# Patient Record
Sex: Female | Born: 1995 | Race: White | Hispanic: No | Marital: Married | Smoking: Never smoker
Health system: Southern US, Community
[De-identification: ages and names within clinical notes are randomized; demographics above are authoritative.]

## PROBLEM LIST (undated history)

## (undated) DIAGNOSIS — H539 Unspecified visual disturbance: Secondary | ICD-10-CM

## (undated) DIAGNOSIS — K209 Esophagitis, unspecified without bleeding: Secondary | ICD-10-CM

## (undated) DIAGNOSIS — J302 Other seasonal allergic rhinitis: Secondary | ICD-10-CM

## (undated) DIAGNOSIS — F419 Anxiety disorder, unspecified: Secondary | ICD-10-CM

## (undated) DIAGNOSIS — R109 Unspecified abdominal pain: Secondary | ICD-10-CM

## (undated) DIAGNOSIS — F32A Depression, unspecified: Secondary | ICD-10-CM

## (undated) DIAGNOSIS — F329 Major depressive disorder, single episode, unspecified: Secondary | ICD-10-CM

## (undated) DIAGNOSIS — T8859XA Other complications of anesthesia, initial encounter: Secondary | ICD-10-CM

## (undated) HISTORY — PX: TONSILLECTOMY AND ADENOIDECTOMY: SUR1326

## (undated) HISTORY — DX: Esophagitis, unspecified without bleeding: K20.90

## (undated) HISTORY — DX: Esophagitis, unspecified: K20.9

## (undated) HISTORY — DX: Other seasonal allergic rhinitis: J30.2

---
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
## 2015-02-10 ENCOUNTER — Ambulatory Visit (HOSPITAL_COMMUNITY): Payer: Self-pay | Admitting: Psychiatry

## 2015-02-12 ENCOUNTER — Encounter (HOSPITAL_COMMUNITY): Payer: Self-pay | Admitting: Psychiatry

## 2015-02-12 ENCOUNTER — Ambulatory Visit (INDEPENDENT_AMBULATORY_CARE_PROVIDER_SITE_OTHER): Payer: 59 | Admitting: Psychiatry

## 2015-02-12 VITALS — BP 114/75 | HR 93 | Ht 61.0 in | Wt 141.2 lb

## 2015-02-12 DIAGNOSIS — F4310 Post-traumatic stress disorder, unspecified: Secondary | ICD-10-CM | POA: Diagnosis not present

## 2015-02-12 DIAGNOSIS — F411 Generalized anxiety disorder: Secondary | ICD-10-CM

## 2015-02-12 DIAGNOSIS — F331 Major depressive disorder, recurrent, moderate: Secondary | ICD-10-CM | POA: Diagnosis not present

## 2015-02-12 DIAGNOSIS — F4010 Social phobia, unspecified: Secondary | ICD-10-CM | POA: Diagnosis not present

## 2015-02-12 MED ORDER — BUPROPION HCL ER (SR) 200 MG PO TB12: ORAL_TABLET | ORAL | Status: DC

## 2015-02-12 NOTE — Progress Notes (Signed)
[facility]  Olivia SatoDarcy Kerns [id number] 20 y.o.  02/12/2015 10:59 AM  Chief Complaint: I'm doing well  History of Present Illness:  Pt states she is doing well.   She is back in school as of yesterday.   Mood has been up and down due to stress but denies depression. Reports she gets irritable quickly and will stay that way for 2 days at a time for no reason. Mood is good   Denies anhedonia, worthlessness and hopelessness. Admits when really upset she has a passing thought of SIB but is able to over come it and has not acted on it. Denies SI/HI and denies AVH.   Denies manic and hypomanic symptoms including periods of decreased need for sleep, increased energy, mood lability, impulsivity, FOI, and excessive spending.  Anxiety remains high. She is trying to control it and takes Vistaril about twice a month when she feels a panic attack might be coming on.   Day to day she is dealing with social anxiety and anxious about everything. In social situations she avoids talking to new people whenever possible. Also also worries about confrontation with people. She has some anxiety about her school weird. Driving makes her nervous.   PTSD- it is better. Triggers cause symptoms but most of time symptoms care stable. Flashbacks and intrusive memories when encountering triggers. Holidays with family can be awkward but went well this year. HV is high.   States that her sleep (7hrs) and appetite are good. Energy is fair.  Patient denies alcohol and substance abuse.  Patient is tolerating her medications well and denies SE. Meds are working well.   Suicidal Ideation: No Plan Formed: No Patient has means to carry out plan: No  Homicidal Ideation: No Plan Formed: No Patient has means to carry out plan: No  Review of Systems: Psychiatric: Agitation: Yes Hallucination: No Depressed Mood: No Insomnia: No Hypersomnia: No Altered Concentration: No Feels Worthless:  No Grandiose Ideas: No Belief In Special Powers: No New/Increased Substance Abuse: No Compulsions: No  Neurologic: Headache: No Seizure: No Paresthesias: No   Review of Systems  Constitutional: Negative for fever and chills.  HENT: Negative for congestion, ear pain, hearing loss, nosebleeds and sore throat.   Eyes: Negative for blurred vision, double vision and pain.  Respiratory: Negative for cough, shortness of breath and wheezing.   Cardiovascular: Negative for chest pain, palpitations and leg swelling.  Gastrointestinal: Negative for heartburn, nausea, vomiting, abdominal pain, diarrhea and constipation.  Musculoskeletal: Positive for joint pain. Negative for back pain, falls and neck pain.  Skin: Negative for itching and rash.  Neurological: Negative for dizziness, tremors, sensory change, seizures, loss of consciousness, weakness and headaches.  Endo/Heme/Allergies: Negative for environmental allergies and polydipsia. Does not bruise/bleed easily.  Psychiatric/Behavioral: Negative for depression, suicidal ideas, hallucinations and substance abuse. The patient is nervous/anxious. The patient does not have insomnia.      Past Medical Family, Social History: works at [employer]. Living with adopted family.  reports that she has never smoked. She has never used smokeless tobacco. She reports that she does not drink alcohol or use illicit drugs.  Family History  Problem Relation Age of Onset  . Adopted: Yes  . Bipolar disorder Mother   . Drug abuse Mother   . Drug abuse Father   . Bipolar disorder Father    Past Medical History  Diagnosis Date  . Seasonal allergies   . Esophagitis   . Vision abnormalities  wears glasses  . Anxiety     takes Citalopram daily   . Depression   . Abdominal pain     daily    Outpatient Encounter Prescriptions as of 02/12/2015  Medication Sig  . buPROPion (WELLBUTRIN SR) 150 MG 12 hr tablet TAKE 1 TABLET (150 MG TOTAL) BY MOUTH  2 (TWO) TIMES DAILY.  . hydrOXYzine (ATARAX/VISTARIL) 10 MG tablet TAKE 1 TABLET (10 MG TOTAL) BY MOUTH 3 (THREE) TIMES DAILY AS NEEDED FOR ANXIETY.  Marland Kitchen norethindrone-ethinyl estradiol-iron (ESTROSTEP FE,TILIA FE,TRI-LEGEST FE) 1-20/1-30/1-35 MG-MCG tablet Take 1 tablet by mouth daily.   No facility-administered encounter medications on file as of 02/12/2015.    Past Psychiatric History/Hospitalization(s): Anxiety: Yes Bipolar Disorder: No Depression: Yes Mania: No Psychosis: No Schizophrenia: No Personality Disorder: No Hospitalization for psychiatric illness: No History of Electroconvulsive Shock Therapy: No Prior Suicide Attempts: Yes  Previous meds- Abilify- weight gain, Celexa  Physical Exam: Constitutional:  BP 114/75 mmHg  Pulse 93  Ht 5\' 1"  (1.549 m)  Wt 141 lb 3.2 oz (64.048 kg)  BMI 26.69 kg/m2  General Appearance: alert, oriented, no acute distress  Musculoskeletal: Strength & Muscle Tone: within normal limits Gait & Station: normal Patient leans: N/A  Mental Status Examination/Evaluation: Objective: Attitude: Calm and cooperative  Appearance: Casual, appears to be stated age  Eye Contact::  Good  Speech:  Clear and Coherent and Normal Rate  Volume:  Normal  Mood:  Mildly anxious   Affect:  Full Range  Thought Process:  Goal Directed, Linear and Logical  Orientation:  Full (Time, Place, and Person)  Thought Content:  WDL   Suicidal Thoughts:  No  Homicidal Thoughts:  No  Judgement:  Fair  Insight:  Fair  Concentration: good  Memory: Immediate-good Recent-good Remote-good  Recall: fair  Language: fair  Gait and Station: normal  [employer] of Knowledge: average  Psychomotor Activity:  Normal  Akathisia:  No  Handed:  Right  AIMS (if indicated):  n/a  Assets:  Communication Skills Desire for Improvement Housing Physical Health Resilience Social Support Talents/Skills Vocational/Educational       Medical Decision Making (Choose Three):  Established Problem, Stable/Improving (1), Review of Psycho-Social Stressors (1), Established Problem, Worsening (2), Review of Medication Regimen & Side Effects (2) and Review of New Medication or Change in Dosage (2)  Assessment: Axis I: PTSD; MDD, recurrent, moderate; GAD; Social anxiety disorder  Axis II: deferred    Plan:  Medication management with supportive therapy. Risks/benefits and SE of the medication discussed. Pt verbalized understanding and verbal consent obtained for treatment.  Affirm with the patient that the medications are taken as ordered. Patient expressed understanding of how their medications were to be used.   increase Bupropion SR to 200mg  po BID for depression Vistaril 10mg  po TID prn anxiety- No script given as pt reports she has some at home.   Therapy: brief supportive therapy provided. Discussed psychosocial stressors in detail.  Pt is no longer therapy at this time. Pt declined therapy referral at this time due to cost.   Pt denies SI and is at an acute low risk for suicide.Patient told to call clinic if any problems occur. Patient advised to go to ER if they should develop SI/HI, side effects, or if symptoms worsen. Has crisis numbers to call if needed. Pt verbalized understanding.  F/up in 2 months or sooner if needed  Oletta Darter MD 02/12/2015

## 2015-04-16 ENCOUNTER — Encounter (HOSPITAL_COMMUNITY): Payer: Self-pay | Admitting: Psychiatry

## 2015-04-16 ENCOUNTER — Ambulatory Visit (INDEPENDENT_AMBULATORY_CARE_PROVIDER_SITE_OTHER): Payer: 59 | Admitting: Psychiatry

## 2015-04-16 VITALS — BP 104/68 | HR 97 | Ht 61.0 in | Wt 131.8 lb

## 2015-04-16 DIAGNOSIS — F4010 Social phobia, unspecified: Secondary | ICD-10-CM | POA: Diagnosis not present

## 2015-04-16 DIAGNOSIS — F331 Major depressive disorder, recurrent, moderate: Secondary | ICD-10-CM

## 2015-04-16 DIAGNOSIS — F411 Generalized anxiety disorder: Secondary | ICD-10-CM

## 2015-04-16 DIAGNOSIS — F4310 Post-traumatic stress disorder, unspecified: Secondary | ICD-10-CM

## 2015-04-16 MED ORDER — BUPROPION HCL ER (SR) 200 MG PO TB12
ORAL_TABLET | ORAL | Status: DC
Start: 2015-04-16 — End: 2015-07-21

## 2015-04-16 NOTE — Progress Notes (Signed)
Patient ID: Olivia Woodard, female   DOB: Jun 11, 1995, 20 y.o.   MRN: [id number]  [facility]  Olivia Woodard [id number] 20 y.o.  04/16/2015 2:32 PM  Chief Complaint: good but stressful  History of Present Illness:  Pt states she is doing well.   Trying to balance school and work and personal life. Pt has made some friends at school. Relationship with boyfriend is good.   Mood has been up and down due to stress but denies depression. Reports irritability has decreased with dose change.   Denies anhedonia, worthlessness and hopelessness. Denies SIB. Denies SI/HI and denies AVH.   Denies manic and hypomanic symptoms including periods of decreased need for sleep, increased energy, mood lability, impulsivity, FOI, and excessive spending.  Anxiety remains high. Pt will have restless energy and clean to avoid stress. She is trying to control it and takes Vistaril about twice a month when she feels a panic attack might be coming on.   Day to day she is dealing with social anxiety and is anxious about everything. In social situations she avoids talking to new people whenever possible. Also also worries about confrontation with people. Feels she is managing it well.  PTSD- it is better. Triggers cause symptoms but most of time symptoms care stable. Flashbacks and intrusive memories when encountering triggers. HV is high.   States that her sleep (7hrs) and appetite are good. Energy is fair.  Patient denies alcohol and substance abuse.  Patient is tolerating her medications well and denies SE. Meds are working well.   Suicidal Ideation: No Plan Formed: No Patient has means to carry out plan: No  Homicidal Ideation: No Plan Formed: No Patient has means to carry out plan: No  Review of Systems: Psychiatric: Agitation: No Hallucination: No Depressed Mood: No Insomnia: No Hypersomnia: No Altered Concentration: No Feels Worthless: No Grandiose Ideas: No Belief In  Special Powers: No New/Increased Substance Abuse: No Compulsions: No   Review of Systems  Constitutional: Negative for fever and chills.  HENT: Negative for congestion, ear pain, hearing loss, nosebleeds and sore throat.   Eyes: Negative for blurred vision, double vision and pain.  Respiratory: Negative for cough, shortness of breath and wheezing.   Cardiovascular: Negative for chest pain, palpitations and leg swelling.  Gastrointestinal: Negative for heartburn, nausea, vomiting, abdominal pain, diarrhea and constipation.  Musculoskeletal: Negative for back pain, joint pain, falls and neck pain.  Skin: Negative for itching and rash.  Neurological: Negative for dizziness, tremors, sensory change, seizures, loss of consciousness, weakness and headaches.  Endo/Heme/Allergies: Negative for environmental allergies and polydipsia. Does not bruise/bleed easily.  Psychiatric/Behavioral: Negative for depression, suicidal ideas, hallucinations and substance abuse. The patient is nervous/anxious. The patient does not have insomnia.      Past Medical Family, Social History: works at [employer]. Living with adopted family.  Going to [employer] and is studying early childhood education.  reports that she has never smoked. She has never used smokeless tobacco. She reports that she does not drink alcohol or use illicit drugs.  Family History  Problem Relation Age of Onset  . Adopted: Yes  . Bipolar disorder Mother   . Drug abuse Mother   . Drug abuse Father   . Bipolar disorder Father    Past Medical History  Diagnosis Date  . Seasonal allergies   . Esophagitis   . Vision abnormalities     wears glasses  . Anxiety     takes Citalopram daily   .  Depression   . Abdominal pain     daily    Outpatient Encounter Prescriptions as of 04/16/2015  Medication Sig  . buPROPion (WELLBUTRIN SR) 200 MG 12 hr tablet TAKE 1 TABLET (200 MG TOTAL) BY MOUTH 2 (TWO) TIMES DAILY.  . hydrOXYzine  (ATARAX/VISTARIL) 10 MG tablet TAKE 1 TABLET (10 MG TOTAL) BY MOUTH 3 (THREE) TIMES DAILY AS NEEDED FOR ANXIETY.  Marland Kitchen norethindrone-ethinyl estradiol-iron (ESTROSTEP FE,TILIA FE,TRI-LEGEST FE) 1-20/1-30/1-35 MG-MCG tablet Take 1 tablet by mouth daily.   No facility-administered encounter medications on file as of 04/16/2015.    Past Psychiatric History/Hospitalization(s): Anxiety: Yes Bipolar Disorder: No Depression: Yes Mania: No Psychosis: No Schizophrenia: No Personality Disorder: No Hospitalization for psychiatric illness: No History of Electroconvulsive Shock Therapy: No Prior Suicide Attempts: Yes  Previous meds- Abilify- weight gain, Celexa  Physical Exam: Constitutional:  BP 104/68 mmHg  Pulse 97  Ht  (1.549 m)  Wt 131 lb 12.8 oz (59.784 kg)  BMI 24.92 kg/m2  LMP 04/01/2015 (Approximate)  General Appearance: alert, oriented, no acute distress  Musculoskeletal: Strength & Muscle Tone: within normal limits Gait & Station: normal Patient leans: straight  Mental Status Examination/Evaluation: Objective: Attitude: Calm and cooperative  Appearance: Casual, appears to be stated age  Eye Contact::  Good  Speech:  Clear and Coherent and Normal Rate  Volume:  Normal  Mood:  Mildly anxious   Affect:  Full Range  Thought Process:  Goal Directed, Linear and Logical  Orientation:  Full (Time, Place, and Person)  Thought Content:  WDL   Suicidal Thoughts:  No  Homicidal Thoughts:  No  Judgement:  Fair  Insight:  Fair  Concentration: good  Memory: Immediate-good Recent-good Remote-good  Recall: fair  Language: fair  Gait and Station: normal  [employer] of Knowledge: average  Psychomotor Activity:  Normal  Akathisia:  No  Handed:  Right  AIMS (if indicated):  n/a  Assets:  Communication Skills Desire for Improvement Housing Physical Health Resilience Social Support Talents/Skills Vocational/Educational       Medical Decision Making (Choose Three):  Established Problem, Stable/Improving (1), Review of Psycho-Social Stressors (1) and Review of Medication Regimen & Side Effects (2)  Assessment: Axis I: PTSD; MDD, recurrent, moderate; GAD; Social anxiety disorder  Axis II: deferred    Plan:  Medication management with supportive therapy. Risks/benefits and SE of the medication discussed. Pt verbalized understanding and verbal consent obtained for treatment.  Affirm with the patient that the medications are taken as ordered. Patient expressed understanding of how their medications were to be used.   Bupropion SR to  po BID for depression Vistaril  po TID prn anxiety- No script given as pt reports she has some at home.   Therapy: brief supportive therapy provided. Discussed psychosocial stressors in detail.  Pt is no longer therapy at this time. Pt declined therapy referral at this time due to cost.   Pt denies SI and is at an acute low risk for suicide.Patient told to call clinic if any problems occur. Patient advised to go to ER if they should develop SI/HI, side effects, or if symptoms worsen. Has crisis numbers to call if needed. Pt verbalized understanding.  F/up in 3 months or sooner if needed  Oletta Darter MD 04/16/2015

## 2015-04-26 ENCOUNTER — Other Ambulatory Visit (HOSPITAL_COMMUNITY): Payer: Self-pay | Admitting: Psychiatry

## 2015-07-21 ENCOUNTER — Encounter (HOSPITAL_COMMUNITY): Payer: Self-pay | Admitting: Psychiatry

## 2015-07-21 ENCOUNTER — Ambulatory Visit (HOSPITAL_COMMUNITY): Payer: Self-pay | Admitting: Psychiatry

## 2015-07-21 ENCOUNTER — Ambulatory Visit (INDEPENDENT_AMBULATORY_CARE_PROVIDER_SITE_OTHER): Payer: 59 | Admitting: Psychiatry

## 2015-07-21 VITALS — BP 110/70 | HR 102 | Ht 61.0 in | Wt 130.0 lb

## 2015-07-21 DIAGNOSIS — F411 Generalized anxiety disorder: Secondary | ICD-10-CM

## 2015-07-21 DIAGNOSIS — F4310 Post-traumatic stress disorder, unspecified: Secondary | ICD-10-CM

## 2015-07-21 DIAGNOSIS — F331 Major depressive disorder, recurrent, moderate: Secondary | ICD-10-CM

## 2015-07-21 DIAGNOSIS — F4010 Social phobia, unspecified: Secondary | ICD-10-CM | POA: Diagnosis not present

## 2015-07-21 MED ORDER — BUPROPION HCL ER (SR) 200 MG PO TB12: ORAL_TABLET | ORAL | Status: DC

## 2015-07-21 NOTE — Progress Notes (Signed)
Patient ID: Olivia Woodard, female   DOB: [birth year], 20 y.o.   MRN: [id number] Patient ID: Olivia Woodard, female   DOB: [birth year], 20 y.o.   MRN: [id number]  [facility] Progress Note  Olivia Woodard [id number] 20 y.o.  07/21/2015 8:40 AM  Chief Complaint: pretty good  History of Present Illness:  Pt states she is doing well.   Relationship with boyfriend is good. Personal life is going well. Off for the summer for school. She managed a 4.0 at the end of the semester.   Mood has been up and down due to stress but denies depression. Mood is more stable since school ended. She is social.    Denies anhedonia, worthlessness and hopelessness. Denies SIB. Denies SI/HI and denies AVH.   Denies manic and hypomanic symptoms including periods of decreased need for sleep, increased energy, mood lability, impulsivity, FOI, and excessive spending.  Anxiety was high at the end of the semester. Pt still has random periods of  restless energy. She is trying to control it and takes Vistaril about twice a month when she feels a panic attack might be coming on.   Day to day she is dealing with social anxiety and is anxious about everything. In social situations she avoids talking to new people whenever possible. Also also worries about confrontation with people. Feels she is managing it well.  PTSD- it is better. Triggers cause symptoms but most of time symptoms care stable. Flashbacks and intrusive memories when encountering triggers. HV is high.   States that her sleep (7hrs) and appetite are good. Energy is fair.  Patient denies alcohol and substance abuse.  Patient is tolerating her medications well and denies SE. Meds are working well.   Suicidal Ideation: No Plan Formed: No Patient has means to carry out plan: No  Homicidal Ideation: No Plan Formed: No Patient has means to carry out plan: No  Review of Systems: Psychiatric: Agitation: No Hallucination: No Depressed Mood: No Insomnia:  No Hypersomnia: No Altered Concentration: No Feels Worthless: No Grandiose Ideas: No Belief In Special Powers: No New/Increased Substance Abuse: No Compulsions: No   Review of Systems  Constitutional: Negative for fever and chills.  HENT: Negative for congestion, ear pain, hearing loss, nosebleeds and sore throat.   Eyes: Negative for blurred vision, double vision and pain.  Respiratory: Negative for cough, shortness of breath and wheezing.   Cardiovascular: Negative for chest pain, palpitations and leg swelling.  Gastrointestinal: Negative for heartburn, nausea, vomiting, abdominal pain, diarrhea and constipation.  Musculoskeletal: Negative for back pain, joint pain, falls and neck pain.  Skin: Negative for itching and rash.  Neurological: Negative for dizziness, tremors, sensory change, seizures, loss of consciousness, weakness and headaches.  Endo/Heme/Allergies: Negative for environmental allergies and polydipsia. Does not bruise/bleed easily.  Psychiatric/Behavioral: Negative for depression, suicidal ideas, hallucinations and substance abuse. The patient is nervous/anxious. The patient does not have insomnia.      Past Medical Family, Social History: works at [employer]. Living with adopted family.  Going to [employer] and is studying early childhood education.  reports that she has never smoked. She has never used smokeless tobacco. She reports that she does not drink alcohol or use illicit drugs.  Family History  Problem Relation Age of Onset  . Adopted: Yes  . Bipolar disorder Mother   . Drug abuse Mother   . Drug abuse Father   . Bipolar disorder Father    Past Medical History  Diagnosis Date  . Seasonal  allergies   . Esophagitis   . Vision abnormalities     wears glasses  . Anxiety     takes Citalopram daily   . Depression   . Abdominal pain     daily    Outpatient Encounter Prescriptions as of 07/21/2015  Medication Sig  . buPROPion (WELLBUTRIN SR) 200 MG  12 hr tablet TAKE 1 TABLET (200 MG TOTAL) BY MOUTH 2 (TWO) TIMES DAILY.  . hydrOXYzine (ATARAX/VISTARIL) 10 MG tablet TAKE 1 TABLET (10 MG TOTAL) BY MOUTH 3 (THREE) TIMES DAILY AS NEEDED FOR ANXIETY.  Marland Kitchen norethindrone-ethinyl estradiol-iron (ESTROSTEP FE,TILIA FE,TRI-LEGEST FE) 1-20/1-30/1-35 MG-MCG tablet Take 1 tablet by mouth daily.   No facility-administered encounter medications on file as of 07/21/2015.    Past Psychiatric History/Hospitalization(s): Anxiety: Yes Bipolar Disorder: No Depression: Yes Mania: No Psychosis: No Schizophrenia: No Personality Disorder: No Hospitalization for psychiatric illness: No History of Electroconvulsive Shock Therapy: No Prior Suicide Attempts: Yes  Previous meds- Abilify- weight gain, Celexa  Physical Exam: Constitutional:  BP 110/70 mmHg  Pulse 102  Ht  (1.549 m)  Wt 130 lb (58.968 kg)  BMI 24.58 kg/m2  General Appearance: alert, oriented, no acute distress  Musculoskeletal: Strength & Muscle Tone: within normal limits Gait & Station: normal Patient leans: straight  Mental Status Examination/Evaluation: Objective: Attitude: Calm and cooperative  Appearance: Casual, appears to be stated age  Eye Contact::  Good  Speech:  Clear and Coherent and Normal Rate  Volume:  Normal  Mood:  Mildly anxious   Affect:  Full Range  Thought Process:  Goal Directed, Linear and Logical  Orientation:  Full (Time, Place, and Person)  Thought Content:  WDL   Suicidal Thoughts:  No  Homicidal Thoughts:  No  Judgement:  Fair  Insight:  Fair  Concentration: good  Memory: Immediate-good Recent-good Remote-good  Recall: fair  Language: fair  Gait and Station: normal  [employer] of Knowledge: average  Psychomotor Activity:  Normal  Akathisia:  No  Handed:  Right  AIMS (if indicated):  n/a  Assets:  Communication Skills Desire for Improvement Housing Physical Health Resilience Social Support Talents/Skills Vocational/Educational        Medical Decision Making (Choose Three): Established Problem, Stable/Improving (1), Review of Psycho-Social Stressors (1) and Review of Medication Regimen & Side Effects (2)  Assessment: Axis I: PTSD; MDD, recurrent, moderate; GAD; Social anxiety disorder  Axis II: deferred    Plan:  Medication management with supportive therapy. Risks/benefits and SE of the medication discussed. Pt verbalized understanding and verbal consent obtained for treatment.  Affirm with the patient that the medications are taken as ordered. Patient expressed understanding of how their medications were to be used.   Bupropion SR to  po BID for depression Vistaril  po TID prn anxiety- No script given as pt reports she has some at home.   Therapy: brief supportive therapy provided. Discussed psychosocial stressors in detail.  Pt is no longer therapy at this time. Pt declined therapy referral at this time due to cost.   Pt denies SI and is at an acute low risk for suicide.Patient told to call clinic if any problems occur. Patient advised to go to ER if they should develop SI/HI, side effects, or if symptoms worsen. Has crisis numbers to call if needed. Pt verbalized understanding.  F/up in 3 months or sooner if needed  Oletta Darter MD 07/21/2015

## 2015-08-07 ENCOUNTER — Other Ambulatory Visit (HOSPITAL_COMMUNITY): Payer: Self-pay | Admitting: Psychiatry

## 2015-10-22 ENCOUNTER — Ambulatory Visit (INDEPENDENT_AMBULATORY_CARE_PROVIDER_SITE_OTHER): Payer: 59 | Admitting: Psychiatry

## 2015-10-22 ENCOUNTER — Encounter (HOSPITAL_COMMUNITY): Payer: Self-pay | Admitting: Psychiatry

## 2015-10-22 DIAGNOSIS — F331 Major depressive disorder, recurrent, moderate: Secondary | ICD-10-CM

## 2015-10-22 DIAGNOSIS — F4310 Post-traumatic stress disorder, unspecified: Secondary | ICD-10-CM | POA: Diagnosis not present

## 2015-10-22 DIAGNOSIS — F4010 Social phobia, unspecified: Secondary | ICD-10-CM | POA: Diagnosis not present

## 2015-10-22 DIAGNOSIS — F411 Generalized anxiety disorder: Secondary | ICD-10-CM | POA: Diagnosis not present

## 2015-10-22 MED ORDER — BUPROPION HCL ER (SR) 200 MG PO TB12: ORAL_TABLET | ORAL | 0 refills | Status: DC

## 2015-10-22 NOTE — Progress Notes (Signed)
Patient ID: Olivia Woodard, female   DOB: [birth year], 20 y.o.   MRN: [id number] Patient ID: Olivia Woodard, female   DOB: May 01, 1995, 20 y.o.   MRN: [id number]  [facility] Progress Note  Olivia Woodard [id number] 20 y.o.  10/22/2015 9:06 AM  Chief Complaint: pretty good  History of Present Illness:  Pt states she is doing well. Pt is working at [employer] and likes it.  Relationship with boyfriend is good. Personal life is going well. This semester is going well and she has figured out how to manage her schedule.   Depression has improved but is still present. Season change always bring on mood changes. She is social.  Denies anhedonia, worthlessness and hopelessness. Denies SIB. Denies SI/HI and denies AVH.   Denies manic and hypomanic symptoms including periods of decreased need for sleep, increased energy, mood lability, impulsivity, FOI, and excessive spending.  Anxiety remains high and always present. Pt still has random periods of restless energy. She is trying to control it and takes Vistaril about twice a month when she feels a panic attack might be coming on.   Day to day she is dealing with social anxiety and is anxious about everything. In social situations she avoids talking to new people whenever possible. Also also worries about confrontation with people. Feels she is managing it well.  PTSD- it is better. Triggers cause symptoms but most of time symptoms care stable. Flashbacks and intrusive memories when encountering triggers. HV is high.   States that her sleep (7hrs) and appetite are good. Energy is fair.  Patient denies alcohol and substance abuse.  Patient is tolerating her medications well and denies SE. Meds are working well.   Suicidal Ideation: No Plan Formed: No Patient has means to carry out plan: No  Homicidal Ideation: No Plan Formed: No Patient has means to carry out plan: No  Review of Systems: Psychiatric: Agitation: No Hallucination:  No Depressed Mood: Yes Insomnia: No Hypersomnia: No Altered Concentration: No Feels Worthless: No Grandiose Ideas: No Belief In Special Powers: No New/Increased Substance Abuse: No Compulsions: No   Review of Systems  Constitutional: Negative for chills and fever.  HENT: Positive for congestion. Negative for ear pain, hearing loss, nosebleeds and sore throat.   Eyes: Negative for blurred vision, double vision and pain.  Respiratory: Negative for cough, shortness of breath and wheezing.   Cardiovascular: Negative for chest pain, palpitations and leg swelling.  Gastrointestinal: Negative for abdominal pain, constipation, diarrhea, heartburn, nausea and vomiting.  Musculoskeletal: Positive for joint pain and neck pain. Negative for back pain and falls.  Skin: Negative for itching and rash.  Neurological: Negative for dizziness, tremors, sensory change, seizures, loss of consciousness, weakness and headaches.  Endo/Heme/Allergies: Positive for environmental allergies. Negative for polydipsia. Does not bruise/bleed easily.  Psychiatric/Behavioral: Negative for depression, hallucinations, substance abuse and suicidal ideas. The patient is nervous/anxious. The patient does not have insomnia.      Past Medical Family, Social History: works at daycare. Living with adopted family.  Going to [employer] and is studying early childhood education.  reports that she has never smoked. She has never used smokeless tobacco. She reports that she does not drink alcohol or use drugs.  Family History  Problem Relation Age of Onset  . Adopted: Yes  . Bipolar disorder Mother   . Drug abuse Mother   . Drug abuse Father   . Bipolar disorder Father    Past Medical History:  Diagnosis Date  .  Abdominal pain    daily  . Anxiety    takes Citalopram daily   . Depression   . Esophagitis   . Seasonal allergies   . Vision abnormalities    wears glasses    Outpatient Encounter Prescriptions as of 10/22/2015   Medication Sig  . buPROPion (WELLBUTRIN SR) 200 MG 12 hr tablet TAKE 1 TABLET (200 MG TOTAL) BY MOUTH 2 (TWO) TIMES DAILY.  . hydrOXYzine (ATARAX/VISTARIL) 10 MG tablet TAKE 1 TABLET (10 MG TOTAL) BY MOUTH 3 (THREE) TIMES DAILY AS NEEDED FOR ANXIETY.  Marland Kitchen. norethindrone-ethinyl estradiol-iron (ESTROSTEP FE,TILIA FE,TRI-LEGEST FE) 1-20/1-30/1-35 MG-MCG tablet Take 1 tablet by mouth daily.   No facility-administered encounter medications on file as of 10/22/2015.     Past Psychiatric History/Hospitalization(s): Anxiety: Yes Bipolar Disorder: No Depression: Yes Mania: No Psychosis: No Schizophrenia: No Personality Disorder: No Hospitalization for psychiatric illness: No History of Electroconvulsive Shock Therapy: No Prior Suicide Attempts: Yes  Previous meds- Abilify- weight gain, Celexa  Physical Exam: Constitutional:  BP 110/70   Pulse 91   Ht 5\' 1"  (1.549 m)   Wt 135 lb 6.4 oz (61.4 kg)   BMI 25.58 kg/m   General Appearance: alert, oriented, no acute distress  Musculoskeletal: Strength & Muscle Tone: within normal limits Gait & Station: normal Patient leans: straight  Mental Status Examination/Evaluation: Objective: Attitude: Calm and cooperative  Appearance: Casual, appears to be stated age  Eye Contact::  Good  Speech:  Clear and Coherent and Normal Rate  Volume:  Normal  Mood:  Mildly anxious   Affect:  Full Range  Thought Process:  Goal Directed, Linear and Logical  Orientation:  Full (Time, Place, and Person)  Thought Content:  WDL   Suicidal Thoughts:  No  Homicidal Thoughts:  No  Judgement:  Fair  Insight:  Fair  Concentration: good  Memory: Immediate-good Recent-good Remote-good  Recall: fair  Language: fair  Gait and Station: normal  [employer] of Knowledge: average  Psychomotor Activity:  Normal  Akathisia:  No  Handed:  Right  AIMS (if indicated):  n/a  Assets:  Communication Skills Desire for Improvement Housing Physical  Health Resilience Social Support Talents/Skills Vocational/Educational        Assessment: Axis I: PTSD; MDD, recurrent, moderate; GAD; Social anxiety disorder  Axis II: deferred    Plan:  Medication management with supportive therapy. Risks/benefits and SE of the medication discussed. Pt verbalized understanding and verbal consent obtained for treatment.  Affirm with the patient that the medications are taken as ordered. Patient expressed understanding of how their medications were to be used.   Bupropion SR to 200mg  po BID for depression Vistaril 10mg  po TID prn anxiety- No script given as pt reports she has some at home.   Therapy: brief supportive therapy provided. Discussed psychosocial stressors in detail.  Pt is no longer therapy at this time. Pt declined therapy referral at this time due to cost.   Pt denies SI and is at an acute low risk for suicide.Patient told to call clinic if any problems occur. Patient advised to go to ER if they should develop SI/HI, side effects, or if symptoms worsen. Has crisis numbers to call if needed. Pt verbalized understanding.  F/up in 3 months or sooner if needed  Oletta DarterSalina Aleks Nawrot MD 10/22/15

## 2015-11-14 ENCOUNTER — Other Ambulatory Visit (HOSPITAL_COMMUNITY): Payer: Self-pay | Admitting: Psychiatry

## 2015-11-14 DIAGNOSIS — F331 Major depressive disorder, recurrent, moderate: Secondary | ICD-10-CM

## 2015-11-14 DIAGNOSIS — F4010 Social phobia, unspecified: Secondary | ICD-10-CM

## 2015-11-14 DIAGNOSIS — F411 Generalized anxiety disorder: Secondary | ICD-10-CM

## 2015-11-14 DIAGNOSIS — F4310 Post-traumatic stress disorder, unspecified: Secondary | ICD-10-CM

## 2016-01-28 ENCOUNTER — Ambulatory Visit (HOSPITAL_COMMUNITY): Payer: Self-pay | Admitting: Psychiatry

## 2016-02-12 ENCOUNTER — Other Ambulatory Visit (HOSPITAL_COMMUNITY): Payer: Self-pay | Admitting: Psychiatry

## 2016-02-12 DIAGNOSIS — F331 Major depressive disorder, recurrent, moderate: Secondary | ICD-10-CM

## 2016-02-12 DIAGNOSIS — F411 Generalized anxiety disorder: Secondary | ICD-10-CM

## 2016-02-12 DIAGNOSIS — F4310 Post-traumatic stress disorder, unspecified: Secondary | ICD-10-CM

## 2016-02-12 DIAGNOSIS — F4010 Social phobia, unspecified: Secondary | ICD-10-CM

## 2016-03-22 ENCOUNTER — Ambulatory Visit (HOSPITAL_COMMUNITY): Payer: Self-pay | Admitting: Psychiatry

## 2016-04-06 ENCOUNTER — Other Ambulatory Visit (HOSPITAL_COMMUNITY): Payer: Self-pay | Admitting: Psychiatry

## 2016-04-06 DIAGNOSIS — F331 Major depressive disorder, recurrent, moderate: Secondary | ICD-10-CM

## 2016-04-06 DIAGNOSIS — F4010 Social phobia, unspecified: Secondary | ICD-10-CM

## 2016-04-06 DIAGNOSIS — F4310 Post-traumatic stress disorder, unspecified: Secondary | ICD-10-CM

## 2016-04-06 DIAGNOSIS — F411 Generalized anxiety disorder: Secondary | ICD-10-CM

## 2016-04-11 ENCOUNTER — Other Ambulatory Visit (HOSPITAL_COMMUNITY): Payer: Self-pay | Admitting: Psychiatry

## 2016-04-11 DIAGNOSIS — F411 Generalized anxiety disorder: Secondary | ICD-10-CM

## 2016-04-11 DIAGNOSIS — F4010 Social phobia, unspecified: Secondary | ICD-10-CM

## 2016-04-11 DIAGNOSIS — F331 Major depressive disorder, recurrent, moderate: Secondary | ICD-10-CM

## 2016-04-11 DIAGNOSIS — F4310 Post-traumatic stress disorder, unspecified: Secondary | ICD-10-CM

## 2016-04-27 ENCOUNTER — Other Ambulatory Visit (HOSPITAL_COMMUNITY): Payer: Self-pay | Admitting: Psychiatry

## 2016-04-27 DIAGNOSIS — F4310 Post-traumatic stress disorder, unspecified: Secondary | ICD-10-CM

## 2016-04-27 DIAGNOSIS — F331 Major depressive disorder, recurrent, moderate: Secondary | ICD-10-CM

## 2016-04-27 DIAGNOSIS — F4010 Social phobia, unspecified: Secondary | ICD-10-CM

## 2016-04-27 DIAGNOSIS — F411 Generalized anxiety disorder: Secondary | ICD-10-CM

## 2016-05-02 ENCOUNTER — Other Ambulatory Visit (HOSPITAL_COMMUNITY): Payer: Self-pay | Admitting: Psychiatry

## 2016-05-02 DIAGNOSIS — F4010 Social phobia, unspecified: Secondary | ICD-10-CM

## 2016-05-02 DIAGNOSIS — F4310 Post-traumatic stress disorder, unspecified: Secondary | ICD-10-CM

## 2016-05-02 DIAGNOSIS — F331 Major depressive disorder, recurrent, moderate: Secondary | ICD-10-CM

## 2016-05-02 DIAGNOSIS — F411 Generalized anxiety disorder: Secondary | ICD-10-CM

## 2016-05-16 ENCOUNTER — Telehealth (HOSPITAL_COMMUNITY): Payer: Self-pay

## 2016-05-16 ENCOUNTER — Other Ambulatory Visit (HOSPITAL_COMMUNITY): Payer: Self-pay

## 2016-05-16 DIAGNOSIS — F4010 Social phobia, unspecified: Secondary | ICD-10-CM

## 2016-05-16 DIAGNOSIS — F331 Major depressive disorder, recurrent, moderate: Secondary | ICD-10-CM

## 2016-05-16 DIAGNOSIS — F411 Generalized anxiety disorder: Secondary | ICD-10-CM

## 2016-05-16 DIAGNOSIS — F4310 Post-traumatic stress disorder, unspecified: Secondary | ICD-10-CM

## 2016-05-16 NOTE — Telephone Encounter (Signed)
Patient called for a refill on her Wellbutrin. She was last seen in September of last year and has a follow up in June. I wanted to ask you first because it has been so long since she has been seen

## 2016-05-19 NOTE — Telephone Encounter (Signed)
Since it has been so long I will wait to discuss meds at her scheduled appt in June.

## 2016-05-19 NOTE — Telephone Encounter (Signed)
I called the patient and left a voicemail and let her know.

## 2016-05-30 ENCOUNTER — Other Ambulatory Visit (HOSPITAL_COMMUNITY): Payer: Self-pay | Admitting: Psychiatry

## 2016-05-30 DIAGNOSIS — F331 Major depressive disorder, recurrent, moderate: Secondary | ICD-10-CM

## 2016-05-30 DIAGNOSIS — F4010 Social phobia, unspecified: Secondary | ICD-10-CM

## 2016-05-30 DIAGNOSIS — F4310 Post-traumatic stress disorder, unspecified: Secondary | ICD-10-CM

## 2016-05-30 DIAGNOSIS — F411 Generalized anxiety disorder: Secondary | ICD-10-CM

## 2016-06-01 ENCOUNTER — Telehealth (HOSPITAL_COMMUNITY): Payer: Self-pay

## 2016-06-01 NOTE — Telephone Encounter (Signed)
Patient called about her Wellbutrin prescription, you had denied it due to the patient not being seen since last September. She had an appointment in February, but it was on a Tuesday and was cancelled because you were no longer working that day. Patient was given a refill at that time and when she called to make the follow up she was told that June 14 was the first available. She said she had enough medication to last until the end of April and has been out for about a week. Her follow up is no June 14 and she would like to know if you would please refill until that date. Please review and advise, thank you

## 2016-06-02 ENCOUNTER — Ambulatory Visit (INDEPENDENT_AMBULATORY_CARE_PROVIDER_SITE_OTHER): Payer: No Typology Code available for payment source | Admitting: Psychiatry

## 2016-06-02 ENCOUNTER — Encounter (HOSPITAL_COMMUNITY): Payer: Self-pay | Admitting: Psychiatry

## 2016-06-02 DIAGNOSIS — F331 Major depressive disorder, recurrent, moderate: Secondary | ICD-10-CM

## 2016-06-02 DIAGNOSIS — F4010 Social phobia, unspecified: Secondary | ICD-10-CM | POA: Diagnosis not present

## 2016-06-02 DIAGNOSIS — F411 Generalized anxiety disorder: Secondary | ICD-10-CM | POA: Diagnosis not present

## 2016-06-02 DIAGNOSIS — F4310 Post-traumatic stress disorder, unspecified: Secondary | ICD-10-CM

## 2016-06-02 DIAGNOSIS — Z813 Family history of other psychoactive substance abuse and dependence: Secondary | ICD-10-CM | POA: Diagnosis not present

## 2016-06-02 DIAGNOSIS — Z818 Family history of other mental and behavioral disorders: Secondary | ICD-10-CM | POA: Diagnosis not present

## 2016-06-02 MED ORDER — BUPROPION HCL ER (SR) 200 MG PO TB12: ORAL_TABLET | ORAL | 0 refills | Status: DC

## 2016-06-02 NOTE — Telephone Encounter (Signed)
Yes we can refill it
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
## 2016-06-02 NOTE — Telephone Encounter (Signed)
This was sent to the pharmacy by Dr. Michae KavaAgarwal, I called the patient and lvm letting her know.

## 2016-07-14 ENCOUNTER — Ambulatory Visit (HOSPITAL_COMMUNITY): Payer: Self-pay | Admitting: Psychiatry

## 2016-08-30 ENCOUNTER — Other Ambulatory Visit (HOSPITAL_COMMUNITY): Payer: Self-pay | Admitting: Psychiatry

## 2016-08-30 DIAGNOSIS — F4310 Post-traumatic stress disorder, unspecified: Secondary | ICD-10-CM

## 2016-08-30 DIAGNOSIS — F331 Major depressive disorder, recurrent, moderate: Secondary | ICD-10-CM

## 2016-08-30 DIAGNOSIS — F411 Generalized anxiety disorder: Secondary | ICD-10-CM

## 2016-08-30 DIAGNOSIS — F4010 Social phobia, unspecified: Secondary | ICD-10-CM

## 2016-09-01 ENCOUNTER — Ambulatory Visit (HOSPITAL_COMMUNITY): Payer: Self-pay | Admitting: Psychiatry

## 2016-10-04 ENCOUNTER — Other Ambulatory Visit (HOSPITAL_COMMUNITY): Payer: Self-pay | Admitting: Psychiatry

## 2016-10-04 DIAGNOSIS — F4310 Post-traumatic stress disorder, unspecified: Secondary | ICD-10-CM

## 2016-10-04 DIAGNOSIS — F331 Major depressive disorder, recurrent, moderate: Secondary | ICD-10-CM

## 2016-10-04 DIAGNOSIS — F4010 Social phobia, unspecified: Secondary | ICD-10-CM

## 2016-10-04 DIAGNOSIS — F411 Generalized anxiety disorder: Secondary | ICD-10-CM

## 2016-10-20 ENCOUNTER — Ambulatory Visit (INDEPENDENT_AMBULATORY_CARE_PROVIDER_SITE_OTHER): Payer: No Typology Code available for payment source | Admitting: Psychiatry

## 2016-10-20 ENCOUNTER — Encounter (HOSPITAL_COMMUNITY): Payer: Self-pay | Admitting: Psychiatry

## 2016-10-20 DIAGNOSIS — M2550 Pain in unspecified joint: Secondary | ICD-10-CM | POA: Diagnosis not present

## 2016-10-20 DIAGNOSIS — F411 Generalized anxiety disorder: Secondary | ICD-10-CM | POA: Diagnosis not present

## 2016-10-20 DIAGNOSIS — M542 Cervicalgia: Secondary | ICD-10-CM

## 2016-10-20 DIAGNOSIS — F331 Major depressive disorder, recurrent, moderate: Secondary | ICD-10-CM | POA: Diagnosis not present

## 2016-10-20 DIAGNOSIS — Z813 Family history of other psychoactive substance abuse and dependence: Secondary | ICD-10-CM | POA: Diagnosis not present

## 2016-10-20 DIAGNOSIS — M549 Dorsalgia, unspecified: Secondary | ICD-10-CM | POA: Diagnosis not present

## 2016-10-20 DIAGNOSIS — F4010 Social phobia, unspecified: Secondary | ICD-10-CM | POA: Diagnosis not present

## 2016-10-20 DIAGNOSIS — Z818 Family history of other mental and behavioral disorders: Secondary | ICD-10-CM | POA: Diagnosis not present

## 2016-10-20 DIAGNOSIS — R450 Nervousness: Secondary | ICD-10-CM | POA: Diagnosis not present

## 2016-10-20 DIAGNOSIS — F4310 Post-traumatic stress disorder, unspecified: Secondary | ICD-10-CM

## 2016-10-20 MED ORDER — BUSPIRONE HCL 5 MG PO TABS: 5.0000 mg | ORAL_TABLET | Freq: Two times a day (BID) | ORAL | 2 refills | Status: DC

## 2016-10-20 MED ORDER — BUPROPION HCL ER (SR) 200 MG PO TB12: ORAL_TABLET | ORAL | 2 refills | Status: DC

## 2016-10-20 NOTE — Progress Notes (Signed)
BH MD/PA/NP OP Progress Note  10/20/2016 4:44 PM Olivia Woodard  MRN:  [id number]  Chief Complaint:  Chief Complaint    Follow-up      HPI:  Pt states depression is on "the back burner. It's not something I focus on". She states she feels overly emotional. 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 Pt states sleep, appetite and energy are good. School has started again and is going well. Pt is socializing some. Pt denies SI/HI.  Anxiety is more prominent. Pt states she is mildly anxious "24/7".  Her PTSD symptoms come on randomly. She feels vulnerable all of the sudden. She is avoiding low cut clothing. Pt denies nightmares and flashbacks. She has random intrusive memories that are not as graphic as before. Pt takes Vistaril about twice a month.   Taking meds as prescribed. She notes that when she takes Vistaril  she feels like her eyes are "wanky".   Visit Diagnosis:    ICD-10-CM   1. MDD (major depressive disorder), recurrent episode, moderate (HCC) F33.1 buPROPion (WELLBUTRIN SR) 200 MG 12 hr tablet  2. PTSD (post-traumatic stress disorder) F43.10 buPROPion (WELLBUTRIN SR) 200 MG 12 hr tablet    busPIRone (BUSPAR) 5 MG tablet  3. GAD (generalized anxiety disorder) F41.1 buPROPion (WELLBUTRIN SR) 200 MG 12 hr tablet    busPIRone (BUSPAR) 5 MG tablet  4. Social anxiety disorder F40.10 buPROPion (WELLBUTRIN SR) 200 MG 12 hr tablet    busPIRone (BUSPAR) 5 MG tablet       Past Psychiatric History:  Anxiety: Yes Bipolar Disorder: No Depression: Yes Mania: No Psychosis: No Schizophrenia: No Personality Disorder: No Hospitalization for psychiatric illness: No History of Electroconvulsive Shock Therapy: No Prior Suicide Attempts: Yes  Previous meds- Abilify- weight gain, Celexa   Past Medical History:  Past Medical History:  Diagnosis Date  . Abdominal pain    daily  . Anxiety    takes Citalopram daily   . Depression   . Esophagitis   . Seasonal allergies   . Vision abnormalities     wears glasses    Past Surgical History:  Procedure Laterality Date  . ESOPHAGOGASTRODUODENOSCOPY  2003  . ESOPHAGOGASTRODUODENOSCOPY N/A 05/04/2012   Procedure: ESOPHAGOGASTRODUODENOSCOPY (EGD);  Surgeon: Jon Gills, MD;  Location: [facility] OR;  Service: Gastroenterology;  Laterality: N/A;  . TONSILLECTOMY AND ADENOIDECTOMY     age 44    Family Psychiatric History:  Family History  Problem Relation Age of Onset  . Adopted: Yes  . Bipolar disorder Mother   . Drug abuse Mother   . Drug abuse Father   . Bipolar disorder Father     Social History:  Social History   Social History  . Marital status: Single    Spouse name: N/A  . Number of children: N/A  . Years of education: N/A   Social History Main Topics  . Smoking status: Never Smoker  . Smokeless tobacco: Never Used  . Alcohol use No  . Drug use: No  . Sexual activity: No   Other Topics Concern  . None   Social History Narrative   10th grade (homeschooled)    Allergies: No Known Allergies  Metabolic Disorder Labs: No results found for: HGBA1C, MPG No results found for: PROLACTIN No results found for: CHOL, TRIG, HDL, CHOLHDL, VLDL, LDLCALC   Current Medications: Current Outpatient Prescriptions  Medication Sig Dispense Refill  . buPROPion (WELLBUTRIN SR) 200 MG 12 hr tablet TAKE 1 TABLET (200 MG TOTAL) BY MOUTH 2 (TWO)  TIMES DAILY. 60 tablet 0  . hydrOXYzine (ATARAX/VISTARIL) 10 MG tablet TAKE 1 TABLET (10 MG TOTAL) BY MOUTH 3 (THREE) TIMES DAILY AS NEEDED FOR ANXIETY. 90 tablet 0  . norethindrone-ethinyl estradiol-iron (ESTROSTEP FE,TILIA FE,TRI-LEGEST FE) 1-20/1-30/1-35 MG-MCG tablet Take 1 tablet by mouth daily.     No current facility-administered medications for this visit.      Musculoskeletal: Strength & Muscle Tone: within normal limits Gait & Station: normal Patient leans: N/A  Psychiatric Specialty Exam: Review of Systems  Musculoskeletal: Positive for back pain, joint pain and neck pain.  Negative for falls.  Psychiatric/Behavioral: Negative for depression, hallucinations, substance abuse and suicidal ideas. The patient is nervous/anxious. The patient does not have insomnia.     Blood pressure 127/84, pulse 98, height  (1.549 m), weight 143 lb 3.2 oz (65 kg).Body mass index is 27.06 kg/m.  General Appearance: Casual  Eye Contact:  Good  Speech:  Clear and Coherent and Normal Rate  Volume:  Normal  Mood:  Anxious and Depressed  Affect:  Full Range  Thought Process:  Coherent, Goal Directed and Descriptions of Associations: Intact  Orientation:  Full (Time, Place, and Person)  Thought Content: Logical   Suicidal Thoughts:  No  Homicidal Thoughts:  No  Memory:  Immediate;   Good Recent;   Good Remote;   Good  Judgement:  Good  Insight:  Good  Psychomotor Activity:  Normal  Concentration:  Concentration: Good and Attention Span: Good  Recall:  Good  Fund of Knowledge: Good  Language: Good  Akathisia:  No  Handed:  Right  AIMS (if indicated):  n/a  Assets:  Housing Physical Health Resilience Social Support  ADL's:  Intact  Cognition: WNL  Sleep:  good     Treatment Plan Summary:Medication management   Assessment: PTSD; MDD-recurrent, moderate; GAD; Social anxiety disorder   Medication management with supportive therapy. Risks/benefits and SE of the medication discussed. Pt verbalized understanding and verbal consent obtained for treatment.  Affirm with the patient that the medications are taken as ordered. Patient expressed understanding of how their medications were to be used.   The risk of un-intended pregnancy is low based on the fact that pt reports she is using a daily OCP. Pt is aware that these meds carry a teratogenic risk. Pt will discuss plan of action if she does or plans to become pregnant in the future.   Meds: Wellbutrin SR  po BID for depression, PTSD D/c Vistaril  Start trial of Buspar  po BID for anxiety - no script  today   Labs: none  Therapy: brief supportive therapy provided. Discussed psychosocial stressors in detail.     Consultations:  None  Pt denies SI and is at an acute low risk for suicide. Patient told to call clinic if any problems occur. Patient advised to go to ER if they should develop SI/HI, side effects, or if symptoms worsen. Has crisis numbers to call if needed. Pt verbalized understanding.  F/up in 3 months or sooner if needed    Oletta Darter, MD 10/20/2016, 4:44 PM

## 2016-12-02 ENCOUNTER — Other Ambulatory Visit (HOSPITAL_COMMUNITY): Payer: Self-pay

## 2016-12-02 DIAGNOSIS — F4310 Post-traumatic stress disorder, unspecified: Secondary | ICD-10-CM

## 2016-12-02 DIAGNOSIS — F4010 Social phobia, unspecified: Secondary | ICD-10-CM

## 2016-12-02 DIAGNOSIS — F411 Generalized anxiety disorder: Secondary | ICD-10-CM

## 2016-12-02 MED ORDER — BUSPIRONE HCL 5 MG PO TABS: 5.0000 mg | ORAL_TABLET | Freq: Two times a day (BID) | ORAL | 1 refills | Status: DC

## 2017-01-04 ENCOUNTER — Other Ambulatory Visit (HOSPITAL_COMMUNITY): Payer: Self-pay

## 2017-01-04 DIAGNOSIS — F4010 Social phobia, unspecified: Secondary | ICD-10-CM

## 2017-01-04 DIAGNOSIS — F331 Major depressive disorder, recurrent, moderate: Secondary | ICD-10-CM

## 2017-01-04 DIAGNOSIS — F411 Generalized anxiety disorder: Secondary | ICD-10-CM

## 2017-01-04 DIAGNOSIS — F4310 Post-traumatic stress disorder, unspecified: Secondary | ICD-10-CM

## 2017-01-04 MED ORDER — BUPROPION HCL ER (SR) 200 MG PO TB12: ORAL_TABLET | ORAL | 0 refills | Status: DC

## 2017-01-26 ENCOUNTER — Encounter (HOSPITAL_COMMUNITY): Payer: Self-pay | Admitting: Psychiatry

## 2017-01-26 ENCOUNTER — Ambulatory Visit (INDEPENDENT_AMBULATORY_CARE_PROVIDER_SITE_OTHER): Payer: No Typology Code available for payment source | Admitting: Psychiatry

## 2017-01-26 DIAGNOSIS — Z818 Family history of other mental and behavioral disorders: Secondary | ICD-10-CM | POA: Diagnosis not present

## 2017-01-26 DIAGNOSIS — F331 Major depressive disorder, recurrent, moderate: Secondary | ICD-10-CM

## 2017-01-26 DIAGNOSIS — F411 Generalized anxiety disorder: Secondary | ICD-10-CM | POA: Diagnosis not present

## 2017-01-26 DIAGNOSIS — F4010 Social phobia, unspecified: Secondary | ICD-10-CM

## 2017-01-26 DIAGNOSIS — F4310 Post-traumatic stress disorder, unspecified: Secondary | ICD-10-CM

## 2017-01-26 DIAGNOSIS — Z813 Family history of other psychoactive substance abuse and dependence: Secondary | ICD-10-CM | POA: Diagnosis not present

## 2017-01-26 MED ORDER — BUSPIRONE HCL 5 MG PO TABS: 5.0000 mg | ORAL_TABLET | Freq: Two times a day (BID) | ORAL | 1 refills | Status: DC

## 2017-01-26 MED ORDER — BUPROPION HCL ER (SR) 200 MG PO TB12: ORAL_TABLET | ORAL | 0 refills | Status: DC

## 2017-01-26 NOTE — Progress Notes (Signed)
BH MD/PA/NP OP Progress Note  01/26/2017 10:16 AM Olivia Woodard  MRN:  [id number]  Chief Complaint:  Chief Complaint    Depression; Follow-up     HPI: "Pretty good". Pt turned 21 in September. She wants to know if she can occasionally have an alcoholic drink.   Depression is "ok". It sometimes "creeps up over the holidays". Pt has issues with her family but enjoys them. For about 1.5 weeks she was unmotivated and was just lying on the couch. Pt just got enjoyed. She has found some motivation and has started working out again. She is going about every other day. Pt denies isolation and anhedonia. Sleep is good. Pt denies SI/HI.   Overall PTSD is well controlled. She has rare intrusive memories with triggers. HV remains high. Pt denies frequent nightmares.  Anxiety is "better". She feels Buspar is helping. She was out for a while when the shortage was going on but has restarted it now.   The holidays were hard on her social anxiety. She didn't want to socialize much but feels she did well. Pt did have a hard time dealing with the attention from her recent engagement.  Now it feels like it back to normal.  Pt states-taking meds as prescribed and denies SE.   Visit Diagnosis:    ICD-10-CM   1. MDD (major depressive disorder), recurrent episode, moderate (HCC) F33.1 buPROPion (WELLBUTRIN SR) 200 MG 12 hr tablet  2. PTSD (post-traumatic stress disorder) F43.10 buPROPion (WELLBUTRIN SR) 200 MG 12 hr tablet    busPIRone (BUSPAR) 5 MG tablet  3. GAD (generalized anxiety disorder) F41.1 buPROPion (WELLBUTRIN SR) 200 MG 12 hr tablet    busPIRone (BUSPAR) 5 MG tablet  4. Social anxiety disorder F40.10 buPROPion (WELLBUTRIN SR) 200 MG 12 hr tablet    busPIRone (BUSPAR) 5 MG tablet       Past Psychiatric History:  Anxiety: Yes Bipolar Disorder: No Depression: Yes Mania: No Psychosis: No Schizophrenia: No Personality Disorder: No Hospitalization for psychiatric illness: No History of  Electroconvulsive Shock Therapy: No Prior Suicide Attempts: Yes  Previous meds- Abilify- weight gain, Celexa   Past Medical History:  Past Medical History:  Diagnosis Date  . Abdominal pain    daily  . Anxiety    takes Citalopram daily   . Depression   . Esophagitis   . Seasonal allergies   . Vision abnormalities    wears glasses    Past Surgical History:  Procedure Laterality Date  . ESOPHAGOGASTRODUODENOSCOPY  2003  . ESOPHAGOGASTRODUODENOSCOPY N/A 05/04/2012   Procedure: ESOPHAGOGASTRODUODENOSCOPY (EGD);  Surgeon: Jon GillsJoseph H Clark, MD;  [facility];  Service: Gastroenterology;  Laterality: N/A;  . TONSILLECTOMY AND ADENOIDECTOMY     age 89    Family Psychiatric History:  Family History  Adopted: Yes  Problem Relation Age of Onset  . Bipolar disorder Mother   . Drug abuse Mother   . Drug abuse Father   . Bipolar disorder Father     Social History:  Social History   Socioeconomic History  . Marital status: Single    Spouse name: None  . Number of children: 0  . Years of education: None  . Highest education level: None  Social Needs  . Financial resource strain: None  . Food insecurity - worry: None  . Food insecurity - inability: None  . Transportation needs - medical: None  . Transportation needs - non-medical: None  Occupational History  . None  Tobacco Use  . Smoking status:  Never Smoker  . Smokeless tobacco: Never Used  Substance and Sexual Activity  . Alcohol use: No  . Drug use: No  . Sexual activity: No    Birth control/protection: Pill  Other Topics Concern  . None  Social History Narrative   10th grade (homeschooled)    Allergies: No Known Allergies  Metabolic Disorder Labs: No results found for: HGBA1C, MPG No results found for: PROLACTIN No results found for: CHOL, TRIG, HDL, CHOLHDL, VLDL, LDLCALC No results found for: TSH  Therapeutic Level Labs: No results found for: LITHIUM No results found for: VALPROATE No components  found for:  CBMZ  Current Medications: Current Outpatient Medications  Medication Sig Dispense Refill  . buPROPion (WELLBUTRIN SR) 200 MG 12 hr tablet TAKE 1 TABLET (200 MG TOTAL) BY MOUTH 2 (TWO) TIMES DAILY. 60 tablet 0  . busPIRone (BUSPAR) 5 MG tablet Take 1 tablet (5 mg total) by mouth 2 (two) times daily. 60 tablet 1  . norethindrone-ethinyl estradiol-iron (ESTROSTEP FE,TILIA FE,TRI-LEGEST FE) 1-20/1-30/1-35 MG-MCG tablet Take 1 tablet by mouth daily.     No current facility-administered medications for this visit.      Musculoskeletal: Strength & Muscle Tone: within normal limits Gait & Station: normal Patient leans: N/A  Psychiatric Specialty Exam: Review of Systems  Constitutional: Negative for chills and fever.  HENT: Negative for congestion, ear pain, nosebleeds, sinus pain and sore throat.   Neurological: Negative for weakness.    Blood pressure 100/68, pulse 99, height 5\' 1"  (1.549 m), weight 146 lb (66.2 kg).Body mass index is 27.59 kg/m.  General Appearance: Casual  Eye Contact:  Good  Speech:  Clear and Coherent and Normal Rate  Volume:  Normal  Mood:  Anxious  Affect:  Full Range  Thought Process:  Goal Directed and Descriptions of Associations: Intact  Orientation:  Full (Time, Place, and Person)  Thought Content: Logical   Suicidal Thoughts:  No  Homicidal Thoughts:  No  Memory:  Immediate;   Good Recent;   Good Remote;   Good  Judgement:  Good  Insight:  Good  Psychomotor Activity:  Normal  Concentration:  Concentration: Good and Attention Span: Good  Recall:  Good  Fund of Knowledge: Good  Language: Good  Akathisia:  No  Handed:  Right  AIMS (if indicated): not done  Assets:  Communication Skills Desire for Improvement Housing Intimacy Leisure Time Transportation Vocational/Educational  ADL's:  Intact  Cognition: WNL  Sleep:  Good   Screenings:   Assessment and Plan: PTSD; MDD-recurrent, moderate; GAD; social anxiety disorder    Medication management with supportive therapy. Risks/benefits and SE of the medication discussed. Pt verbalized understanding and verbal consent obtained for treatment.  Affirm with the patient that the medications are taken as ordered. Patient expressed understanding of how their medications were to be used.   Meds: Wellbutrin SR 200 mg p.o. twice daily for depression and PTSD BuSpar 5 mg p.o. twice daily for anxiety   Labs: none  Therapy: brief supportive therapy provided. Discussed psychosocial stressors in detail.     Consultations: Encouraged to follow up with PCP as needed  Pt denies SI and is at an acute low risk for suicide. Patient told to call clinic if any problems occur. Patient advised to go to ER if they should develop SI/HI, side effects, or if symptoms worsen. Has crisis numbers to call if needed. Pt verbalized understanding.  F/up in 3 months or sooner if needed  Oletta DarterSalina Angelyne Terwilliger, MD 01/26/2017, 10:16 AM

## 2017-01-30 ENCOUNTER — Other Ambulatory Visit (HOSPITAL_COMMUNITY): Payer: Self-pay

## 2017-01-30 DIAGNOSIS — F4310 Post-traumatic stress disorder, unspecified: Secondary | ICD-10-CM

## 2017-01-30 DIAGNOSIS — F4010 Social phobia, unspecified: Secondary | ICD-10-CM

## 2017-01-30 DIAGNOSIS — F411 Generalized anxiety disorder: Secondary | ICD-10-CM

## 2017-01-30 DIAGNOSIS — F331 Major depressive disorder, recurrent, moderate: Secondary | ICD-10-CM

## 2017-01-30 MED ORDER — BUPROPION HCL ER (SR) 200 MG PO TB12: ORAL_TABLET | ORAL | 0 refills | Status: DC

## 2017-04-20 ENCOUNTER — Ambulatory Visit (HOSPITAL_COMMUNITY): Payer: No Typology Code available for payment source | Admitting: Psychiatry

## 2017-04-20 NOTE — Progress Notes (Deleted)
BH MD/PA/NP OP Progress Note  04/20/2017 8:06 AM Olivia Woodard  MRN:  [id number]  Chief Complaint:  HPI: *** Visit Diagnosis: No diagnosis found.  Past Psychiatric History:  Anxiety: Yes Bipolar Disorder: No Depression: Yes Mania: No Psychosis: No Schizophrenia: No Personality Disorder: No Hospitalization for psychiatric illness: No History of Electroconvulsive Shock Therapy: No Prior Suicide Attempts: Yes  Previous meds- Abilify- weight gain, Celexa   Past Medical History:  Past Medical History:  Diagnosis Date  . Abdominal pain    daily  . Anxiety    takes Citalopram daily   . Depression   . Esophagitis   . Seasonal allergies   . Vision abnormalities    wears glasses    Past Surgical History:  Procedure Laterality Date  . ESOPHAGOGASTRODUODENOSCOPY  2003  . ESOPHAGOGASTRODUODENOSCOPY N/A 05/04/2012   Procedure: ESOPHAGOGASTRODUODENOSCOPY (EGD);  Surgeon: Jon Gills, MD;  Location: [facility] OR;  Service: Gastroenterology;  Laterality: N/A;  . TONSILLECTOMY AND ADENOIDECTOMY     age 3    Family Psychiatric History:  Family History  Adopted: Yes  Problem Relation Age of Onset  . Bipolar disorder Mother   . Drug abuse Mother   . Drug abuse Father   . Bipolar disorder Father     Social History:  Social History   Socioeconomic History  . Marital status: Single    Spouse name: Not on file  . Number of children: 0  . Years of education: Not on file  . Highest education level: Not on file  Occupational History  . Not on file  Social Needs  . Financial resource strain: Not on file  . Food insecurity:    Worry: Not on file    Inability: Not on file  . Transportation needs:    Medical: Not on file    Non-medical: Not on file  Tobacco Use  . Smoking status: Never Smoker  . Smokeless tobacco: Never Used  Substance and Sexual Activity  . Alcohol use: Yes    Comment: 2 beers maybe twice a month  . Drug use: No  . Sexual activity: Never    Birth  control/protection: Pill  Lifestyle  . Physical activity:    Days per week: Not on file    Minutes per session: Not on file  . Stress: Not on file  Relationships  . Social connections:    Talks on phone: Not on file    Gets together: Not on file    Attends religious service: Not on file    Active member of club or organization: Not on file    Attends meetings of clubs or organizations: Not on file    Relationship status: Not on file  Other Topics Concern  . Not on file  Social History Narrative   10th grade (homeschooled)    Allergies: No Known Allergies  Metabolic Disorder Labs: No results found for: HGBA1C, MPG No results found for: PROLACTIN No results found for: CHOL, TRIG, HDL, CHOLHDL, VLDL, LDLCALC No results found for: TSH  Therapeutic Level Labs: No results found for: LITHIUM No results found for: VALPROATE No components found for:  CBMZ  Current Medications: Current Outpatient Medications  Medication Sig Dispense Refill  . buPROPion (WELLBUTRIN SR) 200 MG 12 hr tablet TAKE 1 TABLET (200 MG TOTAL) BY MOUTH 2 (TWO) TIMES DAILY. 180 tablet 0  . busPIRone (BUSPAR) 5 MG tablet Take 1 tablet (5 mg total) by mouth 2 (two) times daily. 60 tablet 1  . norethindrone-ethinyl estradiol-iron (  ESTROSTEP FE,TILIA FE,TRI-LEGEST FE) 1-20/1-30/1-35 MG-MCG tablet Take 1 tablet by mouth daily.     No current facility-administered medications for this visit.      Musculoskeletal: Strength & Muscle Tone: {desc; muscle tone:32375} Gait & Station: {PE GAIT ED ZOXW:96045}ATL:22525} Patient leans: {Patient Leans:21022755}  Psychiatric Specialty Exam: ROS  There were no vitals taken for this visit.There is no height or weight on file to calculate BMI.  General Appearance: {Appearance:22683}  Eye Contact:  {BHH EYE CONTACT:22684}  Speech:  {Speech:22685}  Volume:  {Volume (PAA):22686}  Mood:  {BHH MOOD:22306}  Affect:  {Affect (PAA):22687}  Thought Process:  {Thought Process  (PAA):22688}  Orientation:  {BHH ORIENTATION (PAA):22689}  Thought Content: {Thought Content:22690}   Suicidal Thoughts:  {ST/HT (PAA):22692}  Homicidal Thoughts:  {ST/HT (PAA):22692}  Memory:  {BHH MEMORY:22881}  Judgement:  {Judgement (PAA):22694}  Insight:  {Insight (PAA):22695}  Psychomotor Activity:  {Psychomotor (PAA):22696}  Concentration:  {Concentration:21399}  Recall:  {BHH GOOD/FAIR/POOR:22877}  Fund of Knowledge: {BHH GOOD/FAIR/POOR:22877}  Language: {BHH GOOD/FAIR/POOR:22877}  Akathisia:  {BHH YES OR NO:22294}  Handed:  {Handed:22697}  AIMS (if indicated): {Desc; done/not:10129}  Assets:  {Assets (PAA):22698}  ADL's:  {BHH WUJ'W:11914}ADL'S:22290}  Cognition: {chl bhh cognition:304700322}  Sleep:  {BHH GOOD/FAIR/POOR:22877}   Screenings:   Assessment and Plan: PTSD; MDD-recurrent, moderate; GAD; social anxiety disorder    Medication management with supportive therapy. Risks and benefits, side effects and alternative treatment options discussed with patient. Pt was given an opportunity to ask questions about medication, illness, and treatment. All current psychiatric medications have been reviewed and discussed with the patient and adjusted as clinically appropriate. The patient has been provided an accurate and updated list of the medications being now prescribed. Patient expressed understanding of how their medications were to be used.  Pt verbalized understanding and verbal consent obtained for treatment.  The risk of un-intended pregnancy is *** based on the fact that pt reports ***. Pt is aware that these meds carry a teratogenic risk. Pt will discuss plan of action if she does or plans to become pregnant in the future.  Status of current problems: ***  Meds: Wellbutrin SR 200 mg p.o. twice daily for MDD and PTSD BuSpar 5 mg p.o. twice daily for GAD and social anxiety disorder   Labs: none  Therapy: brief supportive therapy provided. Discussed psychosocial stressors in  detail.     Consultations: none  ***Pt denies SI and is at an acute low risk for suicide. Patient told to call clinic if any problems occur. Patient advised to go to ER if they should develop SI/HI, side effects, or if symptoms worsen. Has crisis numbers to call if needed. Pt verbalized understanding.  F/up in *** months or sooner if needed    Oletta DarterSalina Orvell Careaga, MD 04/20/2017, 8:06 AM

## 2017-04-28 ENCOUNTER — Other Ambulatory Visit (HOSPITAL_COMMUNITY): Payer: Self-pay | Admitting: Psychiatry

## 2017-04-28 DIAGNOSIS — F411 Generalized anxiety disorder: Secondary | ICD-10-CM

## 2017-04-28 DIAGNOSIS — F4010 Social phobia, unspecified: Secondary | ICD-10-CM

## 2017-04-28 DIAGNOSIS — F4310 Post-traumatic stress disorder, unspecified: Secondary | ICD-10-CM

## 2017-04-28 DIAGNOSIS — F331 Major depressive disorder, recurrent, moderate: Secondary | ICD-10-CM

## 2017-08-07 ENCOUNTER — Other Ambulatory Visit (HOSPITAL_COMMUNITY): Payer: Self-pay | Admitting: Psychiatry

## 2017-08-07 DIAGNOSIS — F4010 Social phobia, unspecified: Secondary | ICD-10-CM

## 2017-08-07 DIAGNOSIS — F331 Major depressive disorder, recurrent, moderate: Secondary | ICD-10-CM

## 2017-08-07 DIAGNOSIS — F411 Generalized anxiety disorder: Secondary | ICD-10-CM

## 2017-08-07 DIAGNOSIS — F4310 Post-traumatic stress disorder, unspecified: Secondary | ICD-10-CM

## 2017-08-08 ENCOUNTER — Other Ambulatory Visit (HOSPITAL_COMMUNITY): Payer: Self-pay

## 2017-08-08 DIAGNOSIS — F411 Generalized anxiety disorder: Secondary | ICD-10-CM

## 2017-08-08 DIAGNOSIS — F4010 Social phobia, unspecified: Secondary | ICD-10-CM

## 2017-08-08 DIAGNOSIS — F4310 Post-traumatic stress disorder, unspecified: Secondary | ICD-10-CM

## 2017-08-08 DIAGNOSIS — F331 Major depressive disorder, recurrent, moderate: Secondary | ICD-10-CM

## 2017-08-08 MED ORDER — BUPROPION HCL ER (SR) 200 MG PO TB12: ORAL_TABLET | ORAL | 0 refills | Status: DC
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
## 2017-09-02 ENCOUNTER — Other Ambulatory Visit: Payer: Self-pay

## 2017-09-02 ENCOUNTER — Encounter (HOSPITAL_COMMUNITY): Payer: Self-pay | Admitting: Psychiatry

## 2017-09-02 ENCOUNTER — Ambulatory Visit (INDEPENDENT_AMBULATORY_CARE_PROVIDER_SITE_OTHER): Payer: No Typology Code available for payment source | Admitting: Psychiatry

## 2017-09-02 DIAGNOSIS — N920 Excessive and frequent menstruation with regular cycle: Secondary | ICD-10-CM | POA: Insufficient documentation

## 2017-09-02 DIAGNOSIS — Z813 Family history of other psychoactive substance abuse and dependence: Secondary | ICD-10-CM

## 2017-09-02 DIAGNOSIS — F331 Major depressive disorder, recurrent, moderate: Secondary | ICD-10-CM | POA: Diagnosis not present

## 2017-09-02 DIAGNOSIS — F4010 Social phobia, unspecified: Secondary | ICD-10-CM | POA: Diagnosis not present

## 2017-09-02 DIAGNOSIS — N946 Dysmenorrhea, unspecified: Secondary | ICD-10-CM | POA: Insufficient documentation

## 2017-09-02 DIAGNOSIS — F909 Attention-deficit hyperactivity disorder, unspecified type: Secondary | ICD-10-CM | POA: Insufficient documentation

## 2017-09-02 DIAGNOSIS — F411 Generalized anxiety disorder: Secondary | ICD-10-CM | POA: Diagnosis not present

## 2017-09-02 DIAGNOSIS — Z811 Family history of alcohol abuse and dependence: Secondary | ICD-10-CM

## 2017-09-02 DIAGNOSIS — Z818 Family history of other mental and behavioral disorders: Secondary | ICD-10-CM

## 2017-09-02 DIAGNOSIS — F4310 Post-traumatic stress disorder, unspecified: Secondary | ICD-10-CM | POA: Diagnosis not present

## 2017-09-02 MED ORDER — BUSPIRONE HCL 10 MG PO TABS
10.0000 mg | ORAL_TABLET | Freq: Two times a day (BID) | ORAL | 2 refills | Status: DC
Start: 2017-09-02 — End: 2017-09-16

## 2017-09-02 MED ORDER — BUPROPION HCL ER (SR) 200 MG PO TB12: ORAL_TABLET | ORAL | 2 refills | Status: DC

## 2017-09-16 ENCOUNTER — Other Ambulatory Visit (HOSPITAL_COMMUNITY): Payer: Self-pay | Admitting: Psychiatry

## 2017-09-16 DIAGNOSIS — F4010 Social phobia, unspecified: Secondary | ICD-10-CM

## 2017-09-16 DIAGNOSIS — F4310 Post-traumatic stress disorder, unspecified: Secondary | ICD-10-CM

## 2017-09-16 DIAGNOSIS — F411 Generalized anxiety disorder: Secondary | ICD-10-CM

## 2017-12-14 ENCOUNTER — Ambulatory Visit (HOSPITAL_COMMUNITY): Payer: Self-pay | Admitting: Psychiatry

## 2017-12-14 ENCOUNTER — Other Ambulatory Visit (HOSPITAL_COMMUNITY): Payer: Self-pay | Admitting: Psychiatry

## 2017-12-14 DIAGNOSIS — F4010 Social phobia, unspecified: Secondary | ICD-10-CM

## 2017-12-14 DIAGNOSIS — F411 Generalized anxiety disorder: Secondary | ICD-10-CM

## 2017-12-14 DIAGNOSIS — F4310 Post-traumatic stress disorder, unspecified: Secondary | ICD-10-CM

## 2017-12-14 DIAGNOSIS — F331 Major depressive disorder, recurrent, moderate: Secondary | ICD-10-CM

## 2017-12-21 ENCOUNTER — Other Ambulatory Visit (HOSPITAL_COMMUNITY): Payer: Self-pay | Admitting: Psychiatry

## 2017-12-21 DIAGNOSIS — F4010 Social phobia, unspecified: Secondary | ICD-10-CM

## 2017-12-21 DIAGNOSIS — F4310 Post-traumatic stress disorder, unspecified: Secondary | ICD-10-CM

## 2017-12-21 DIAGNOSIS — F411 Generalized anxiety disorder: Secondary | ICD-10-CM

## 2018-01-09 ENCOUNTER — Telehealth (HOSPITAL_COMMUNITY): Payer: Self-pay

## 2018-01-09 NOTE — Telephone Encounter (Signed)
Patient is calling for a refill on her medication. Patient was informed that she no showed her appointment last month and patient rescheduled for next month. She is looking for a refill on Buspar and Bupropion

## 2018-01-11 ENCOUNTER — Other Ambulatory Visit (HOSPITAL_COMMUNITY): Payer: Self-pay | Admitting: Psychiatry

## 2018-01-11 DIAGNOSIS — F4310 Post-traumatic stress disorder, unspecified: Secondary | ICD-10-CM

## 2018-01-11 DIAGNOSIS — F4010 Social phobia, unspecified: Secondary | ICD-10-CM

## 2018-01-11 DIAGNOSIS — F411 Generalized anxiety disorder: Secondary | ICD-10-CM

## 2018-01-11 DIAGNOSIS — F331 Major depressive disorder, recurrent, moderate: Secondary | ICD-10-CM

## 2018-01-11 MED ORDER — BUSPIRONE HCL 10 MG PO TABS: 10.0000 mg | ORAL_TABLET | Freq: Two times a day (BID) | ORAL | 0 refills | Status: DC

## 2018-01-11 MED ORDER — BUPROPION HCL ER (SR) 200 MG PO TB12: ORAL_TABLET | ORAL | 0 refills | Status: DC

## 2018-02-11 ENCOUNTER — Other Ambulatory Visit (HOSPITAL_COMMUNITY): Payer: Self-pay | Admitting: Psychiatry

## 2018-02-11 DIAGNOSIS — F331 Major depressive disorder, recurrent, moderate: Secondary | ICD-10-CM

## 2018-02-11 DIAGNOSIS — F4010 Social phobia, unspecified: Secondary | ICD-10-CM

## 2018-02-11 DIAGNOSIS — F411 Generalized anxiety disorder: Secondary | ICD-10-CM

## 2018-02-11 DIAGNOSIS — F4310 Post-traumatic stress disorder, unspecified: Secondary | ICD-10-CM

## 2018-02-17 ENCOUNTER — Ambulatory Visit (INDEPENDENT_AMBULATORY_CARE_PROVIDER_SITE_OTHER): Payer: BC Managed Care – PPO | Admitting: Psychiatry

## 2018-02-17 ENCOUNTER — Encounter (HOSPITAL_COMMUNITY): Payer: Self-pay | Admitting: Psychiatry

## 2018-02-17 ENCOUNTER — Other Ambulatory Visit: Payer: Self-pay

## 2018-02-17 VITALS — BP 118/72 | Temp 97.3°F | Ht 61.0 in | Wt 126.4 lb

## 2018-02-17 DIAGNOSIS — F331 Major depressive disorder, recurrent, moderate: Secondary | ICD-10-CM | POA: Diagnosis not present

## 2018-02-17 DIAGNOSIS — F411 Generalized anxiety disorder: Secondary | ICD-10-CM | POA: Diagnosis not present

## 2018-02-17 DIAGNOSIS — F4310 Post-traumatic stress disorder, unspecified: Secondary | ICD-10-CM

## 2018-02-17 DIAGNOSIS — F4010 Social phobia, unspecified: Secondary | ICD-10-CM

## 2018-02-17 MED ORDER — BUSPIRONE HCL 10 MG PO TABS: 10.0000 mg | ORAL_TABLET | Freq: Two times a day (BID) | ORAL | 2 refills | Status: DC

## 2018-02-17 MED ORDER — BUPROPION HCL ER (SR) 200 MG PO TB12: ORAL_TABLET | ORAL | 2 refills | Status: DC
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
## 2018-03-18 ENCOUNTER — Other Ambulatory Visit (HOSPITAL_COMMUNITY): Payer: Self-pay | Admitting: Psychiatry

## 2018-03-18 DIAGNOSIS — F331 Major depressive disorder, recurrent, moderate: Secondary | ICD-10-CM

## 2018-03-18 DIAGNOSIS — F411 Generalized anxiety disorder: Secondary | ICD-10-CM

## 2018-03-18 DIAGNOSIS — F4310 Post-traumatic stress disorder, unspecified: Secondary | ICD-10-CM

## 2018-03-18 DIAGNOSIS — F4010 Social phobia, unspecified: Secondary | ICD-10-CM

## 2018-05-05 ENCOUNTER — Ambulatory Visit (HOSPITAL_COMMUNITY): Payer: BC Managed Care – PPO | Admitting: Psychiatry

## 2018-05-18 ENCOUNTER — Other Ambulatory Visit (HOSPITAL_COMMUNITY): Payer: Self-pay | Admitting: Psychiatry

## 2018-05-18 DIAGNOSIS — F4010 Social phobia, unspecified: Secondary | ICD-10-CM

## 2018-05-18 DIAGNOSIS — F4310 Post-traumatic stress disorder, unspecified: Secondary | ICD-10-CM

## 2018-05-18 DIAGNOSIS — F331 Major depressive disorder, recurrent, moderate: Secondary | ICD-10-CM

## 2018-05-18 DIAGNOSIS — F411 Generalized anxiety disorder: Secondary | ICD-10-CM

## 2018-07-16 ENCOUNTER — Other Ambulatory Visit (HOSPITAL_COMMUNITY): Payer: Self-pay

## 2018-07-16 DIAGNOSIS — F4010 Social phobia, unspecified: Secondary | ICD-10-CM

## 2018-07-16 DIAGNOSIS — F411 Generalized anxiety disorder: Secondary | ICD-10-CM

## 2018-07-16 DIAGNOSIS — F331 Major depressive disorder, recurrent, moderate: Secondary | ICD-10-CM

## 2018-07-16 DIAGNOSIS — F4310 Post-traumatic stress disorder, unspecified: Secondary | ICD-10-CM

## 2018-07-16 MED ORDER — BUSPIRONE HCL 10 MG PO TABS: 10.0000 mg | ORAL_TABLET | Freq: Two times a day (BID) | ORAL | 0 refills | Status: DC

## 2018-07-16 MED ORDER — BUPROPION HCL ER (SR) 200 MG PO TB12: ORAL_TABLET | ORAL | 0 refills | Status: DC

## 2018-07-26 ENCOUNTER — Encounter (HOSPITAL_COMMUNITY): Payer: Self-pay | Admitting: Psychiatry

## 2018-07-26 ENCOUNTER — Ambulatory Visit (INDEPENDENT_AMBULATORY_CARE_PROVIDER_SITE_OTHER): Payer: BC Managed Care – PPO | Admitting: Psychiatry

## 2018-07-26 ENCOUNTER — Other Ambulatory Visit: Payer: Self-pay

## 2018-07-26 DIAGNOSIS — F411 Generalized anxiety disorder: Secondary | ICD-10-CM

## 2018-07-26 DIAGNOSIS — F4010 Social phobia, unspecified: Secondary | ICD-10-CM

## 2018-07-26 DIAGNOSIS — F4310 Post-traumatic stress disorder, unspecified: Secondary | ICD-10-CM | POA: Diagnosis not present

## 2018-07-26 DIAGNOSIS — F331 Major depressive disorder, recurrent, moderate: Secondary | ICD-10-CM | POA: Diagnosis not present

## 2018-07-26 MED ORDER — BUPROPION HCL ER (SR) 200 MG PO TB12: ORAL_TABLET | ORAL | 0 refills | Status: DC

## 2018-07-26 MED ORDER — BUSPIRONE HCL 15 MG PO TABS: 15.0000 mg | ORAL_TABLET | Freq: Two times a day (BID) | ORAL | 0 refills | Status: DC

## 2018-07-26 NOTE — Progress Notes (Signed)
Virtual Visit via Telephone Note  I connected with Olivia Woodard on 07/26/18 at  1:45 PM EDT by telephone and verified that I am speaking with the correct person using two identifiers.  Location: Patient: home Provider: office   I discussed the limitations, risks, security and privacy concerns of performing an evaluation and management service by telephone and the availability of in person appointments. I also discussed with the patient that there may be a patient responsible charge related to this service. The patient expressed understanding and agreed to proceed.   History of Present Illness: She is doing alright. It was difficult to adjust to the sudden change to working and studying from home. Her depression very mild. Olivia Woodard reports she has situational sadness. She denies hopelessness. She denies SI/HI. Sleep is poor due to being home. Energy and appetite are ok. Her anxiety is up. She is cleaning a lot. Social anxiety is up if she has to go out. She doesn't like to go out alone and makes her husband go out with her. PTSD is overall ok. A few weeks ago she watched a movie that triggered her. She had HV, intrusive memories and she wasn't able to sleep for several nights.    Observations/Objective: I spoke with Olivia Woodard on the phone.  Pt was calm, pleasant and cooperative.  Pt was engaged in the conversation and answered questions appropriately.  Speech was clear and coherent with normal rate, tone and volume.  Mood is anxious, affect is full. Thought processes are coherent and intact.  Thought content is logical.  Pt denies SI/HI.   Pt denies auditory and visual hallucinations and did not appear to be responding to internal stimuli.  Memory and concentration are good.  Fund of knowledge and use of language are average.  Insight and judgment are fair.  I am unable to comment on psychomotor activity, general appearance, hygiene, or eye contact as I was unable to physically see the patient on the  phone.   Assessment and Plan: PTSD; MDD-recurrent, moderate; GAD; social anxiety disorder  Wellbutrin SR 200 mg p.o. twice daily BuSpar 10 mg p.o. twice daily   Follow Up Instructions: In 2 months or sooner if needed   I discussed the assessment and treatment plan with the patient. The patient was provided an opportunity to ask questions and all were answered. The patient agreed with the plan and demonstrated an understanding of the instructions.   The patient was advised to call back or seek an in-person evaluation if the symptoms worsen or if the condition fails to improve as anticipated.  I provided 20 minutes of non-face-to-face time during this encounter.   Charlcie Cradle, MD

## 2018-11-22 ENCOUNTER — Other Ambulatory Visit (HOSPITAL_COMMUNITY): Payer: Self-pay | Admitting: Psychiatry

## 2018-11-22 DIAGNOSIS — F4010 Social phobia, unspecified: Secondary | ICD-10-CM

## 2018-11-22 DIAGNOSIS — F4310 Post-traumatic stress disorder, unspecified: Secondary | ICD-10-CM

## 2018-11-22 DIAGNOSIS — F411 Generalized anxiety disorder: Secondary | ICD-10-CM

## 2018-11-22 DIAGNOSIS — F331 Major depressive disorder, recurrent, moderate: Secondary | ICD-10-CM

## 2018-11-27 ENCOUNTER — Other Ambulatory Visit: Payer: Self-pay | Admitting: Obstetrics and Gynecology

## 2018-11-27 DIAGNOSIS — N6320 Unspecified lump in the left breast, unspecified quadrant: Secondary | ICD-10-CM

## 2018-11-30 ENCOUNTER — Telehealth (HOSPITAL_COMMUNITY): Payer: Self-pay

## 2018-11-30 NOTE — Telephone Encounter (Signed)
Patient called requesting a refills on her Bupropion 200mg  and her Buspirone 15mg  to be sent to [facility] on [address] in [place]. She has a scheduled appointment for 12/04/18. Thank you

## 2018-12-04 ENCOUNTER — Other Ambulatory Visit: Payer: Self-pay | Admitting: Obstetrics and Gynecology

## 2018-12-04 ENCOUNTER — Ambulatory Visit
Admission: RE | Admit: 2018-12-04 | Discharge: 2018-12-04 | Disposition: A | Discharge status: Home / Self Care | Payer: BC Managed Care – PPO | Source: Ambulatory Visit | Attending: Obstetrics and Gynecology | Admitting: Obstetrics and Gynecology

## 2018-12-04 ENCOUNTER — Other Ambulatory Visit (HOSPITAL_COMMUNITY): Payer: Self-pay

## 2018-12-04 ENCOUNTER — Other Ambulatory Visit: Payer: Self-pay

## 2018-12-04 DIAGNOSIS — N6320 Unspecified lump in the left breast, unspecified quadrant: Secondary | ICD-10-CM

## 2018-12-04 DIAGNOSIS — F411 Generalized anxiety disorder: Secondary | ICD-10-CM

## 2018-12-04 DIAGNOSIS — F4010 Social phobia, unspecified: Secondary | ICD-10-CM

## 2018-12-04 DIAGNOSIS — F4310 Post-traumatic stress disorder, unspecified: Secondary | ICD-10-CM

## 2018-12-04 DIAGNOSIS — F331 Major depressive disorder, recurrent, moderate: Secondary | ICD-10-CM

## 2018-12-04 MED ORDER — BUPROPION HCL ER (SR) 200 MG PO TB12: ORAL_TABLET | ORAL | 0 refills | Status: DC

## 2018-12-04 MED ORDER — BUSPIRONE HCL 15 MG PO TABS: 15.0000 mg | ORAL_TABLET | Freq: Two times a day (BID) | ORAL | 0 refills | Status: DC

## 2018-12-04 NOTE — Progress Notes (Unsigned)
Patient called for refills, I made her an appointment for 11/12 and per protocol I sent in a 30 day supply of each medication.

## 2018-12-10 ENCOUNTER — Other Ambulatory Visit: Payer: Self-pay

## 2018-12-10 ENCOUNTER — Other Ambulatory Visit: Payer: Self-pay | Admitting: Obstetrics and Gynecology

## 2018-12-10 ENCOUNTER — Ambulatory Visit
Admission: RE | Admit: 2018-12-10 | Discharge: 2018-12-10 | Disposition: A | Discharge status: Home / Self Care | Payer: BC Managed Care – PPO | Source: Ambulatory Visit | Attending: Obstetrics and Gynecology | Admitting: Obstetrics and Gynecology

## 2018-12-10 DIAGNOSIS — N6320 Unspecified lump in the left breast, unspecified quadrant: Secondary | ICD-10-CM

## 2018-12-13 ENCOUNTER — Ambulatory Visit (INDEPENDENT_AMBULATORY_CARE_PROVIDER_SITE_OTHER): Payer: BC Managed Care – PPO | Admitting: Psychiatry

## 2018-12-13 ENCOUNTER — Other Ambulatory Visit: Payer: Self-pay

## 2018-12-13 DIAGNOSIS — Z5329 Procedure and treatment not carried out because of patient's decision for other reasons: Secondary | ICD-10-CM

## 2018-12-13 NOTE — Progress Notes (Signed)
I called Tennie twice and there was no answer. I was able to leave a VM the first time. The second time "the voice mail box is full and can not accept any messages at this time".

## 2019-01-01 ENCOUNTER — Other Ambulatory Visit (HOSPITAL_COMMUNITY): Payer: Self-pay | Admitting: Psychiatry

## 2019-01-01 DIAGNOSIS — F4010 Social phobia, unspecified: Secondary | ICD-10-CM

## 2019-01-01 DIAGNOSIS — F331 Major depressive disorder, recurrent, moderate: Secondary | ICD-10-CM

## 2019-01-01 DIAGNOSIS — F4310 Post-traumatic stress disorder, unspecified: Secondary | ICD-10-CM

## 2019-01-01 DIAGNOSIS — F411 Generalized anxiety disorder: Secondary | ICD-10-CM

## 2019-01-31 ENCOUNTER — Telehealth (HOSPITAL_COMMUNITY): Payer: Self-pay | Admitting: *Deleted

## 2019-01-31 NOTE — Telephone Encounter (Signed)
Received message from pt requesting refills on both the Wellbutrin and Buspar. Pt has not been seen since 06/20 and was no show on 12/13/18. Pt does have an upcoming appointment on 03/07/19. Please review and advise.

## 2019-02-07 NOTE — Telephone Encounter (Signed)
No refill until she is seen for appointment

## 2019-02-12 ENCOUNTER — Telehealth (HOSPITAL_COMMUNITY): Payer: Self-pay | Admitting: *Deleted

## 2019-02-12 ENCOUNTER — Other Ambulatory Visit (HOSPITAL_COMMUNITY): Payer: Self-pay | Admitting: *Deleted

## 2019-02-12 DIAGNOSIS — F4010 Social phobia, unspecified: Secondary | ICD-10-CM

## 2019-02-12 DIAGNOSIS — F331 Major depressive disorder, recurrent, moderate: Secondary | ICD-10-CM

## 2019-02-12 DIAGNOSIS — F411 Generalized anxiety disorder: Secondary | ICD-10-CM

## 2019-02-12 DIAGNOSIS — F4310 Post-traumatic stress disorder, unspecified: Secondary | ICD-10-CM

## 2019-02-12 MED ORDER — BUSPIRONE HCL 15 MG PO TABS: ORAL_TABLET | ORAL | 0 refills | Status: DC

## 2019-02-12 MED ORDER — BUPROPION HCL ER (SR) 200 MG PO TB12: ORAL_TABLET | ORAL | 0 refills | Status: DC

## 2019-02-12 NOTE — Telephone Encounter (Signed)
FYI Pt left message requesting refill of Buspar and Wellbutrin last ordered 01/10/20.  Pt now has an upcoming appointment 03/07/19. Refills sent to [facility], [address] [place].

## 2019-02-14 NOTE — Telephone Encounter (Signed)
Ok thanks 

## 2019-03-07 ENCOUNTER — Encounter (HOSPITAL_COMMUNITY): Payer: Self-pay | Admitting: Psychiatry

## 2019-03-07 ENCOUNTER — Ambulatory Visit (INDEPENDENT_AMBULATORY_CARE_PROVIDER_SITE_OTHER): Payer: BC Managed Care – PPO | Admitting: Psychiatry

## 2019-03-07 ENCOUNTER — Other Ambulatory Visit: Payer: Self-pay

## 2019-03-07 DIAGNOSIS — F411 Generalized anxiety disorder: Secondary | ICD-10-CM | POA: Diagnosis not present

## 2019-03-07 DIAGNOSIS — F4010 Social phobia, unspecified: Secondary | ICD-10-CM | POA: Diagnosis not present

## 2019-03-07 DIAGNOSIS — F331 Major depressive disorder, recurrent, moderate: Secondary | ICD-10-CM | POA: Diagnosis not present

## 2019-03-07 DIAGNOSIS — F4310 Post-traumatic stress disorder, unspecified: Secondary | ICD-10-CM | POA: Diagnosis not present

## 2019-03-07 MED ORDER — BUPROPION HCL ER (SR) 200 MG PO TB12: ORAL_TABLET | ORAL | 0 refills | Status: DC

## 2019-03-07 MED ORDER — BUSPIRONE HCL 15 MG PO TABS: ORAL_TABLET | ORAL | 0 refills | Status: DC

## 2019-03-07 NOTE — Progress Notes (Signed)
Virtual Visit via Telephone Note  I connected with Olivia Woodard on 03/07/19 at  3:30 PM EST by telephone and verified that I am speaking with the correct person using two identifiers.  Location: Patient: driving in car Provider: office   I discussed the limitations, risks, security and privacy concerns of performing an evaluation and management service by telephone and the availability of in person appointments. I also discussed with the patient that there may be a patient responsible charge related to this service. The patient expressed understanding and agreed to proceed.   History of Present Illness: Olivia Woodard states she is doing well. She ran out of meds for a little while but did well. She is back on meds and feels fine. Her anxiety is the best it has been. She is not overly anxious about anything. She has not been in any situations where her anxiety or social anxiety would spike. Her PTSD is manageable. She only experiences intrusive memories with triggers from her family. The biggest concern she has is recently getting married and dealing with her past. Her depression is ok now. She had an episode around the holidays when school ended. She was able to notice when the symptoms began and she able to motivate herself to do things. It helped a lot so that her depression did not get really bad. Today her depression is mild and in the background. She is not overly concerned it. Sleep and appetite are good. She denies SIB. She denies SI/HI.    Observations/Objective:  General Appearance: unable to assess  Eye Contact:  unable to assess  Speech:  Clear and Coherent and Normal Rate  Volume:  Normal  Mood:  Euthymic  Affect:  Full Range  Thought Process:  Goal Directed, Linear and Descriptions of Associations: Intact  Orientation:  Full (Time, Place, and Person)  Thought Content:  Logical  Suicidal Thoughts:  No  Homicidal Thoughts:  No  Memory:  Immediate;   Good  Judgement:  Good   Insight:  Good  Psychomotor Activity:  Normal  Concentration:  Concentration: Good  Recall:  Good  Fund of Knowledge:  Good  Language:  Good  Akathisia:  unable to assess  Handed:  Right  AIMS (if indicated):     Assets:  Communication Skills Desire for Improvement Financial Resources/Insurance Housing Intimacy Leisure Time Resilience Social Support Talents/Skills Transportation Vocational/Educational  ADL's:  Unable to assess  Cognition:  WNL  Sleep:        Assessment and Plan: PTSD; MDD-recurrent, moderate; GAD; Social anxiety disorder  Wellbutrin SR 200mg  po BID Buspar 10mg  po BID  Encouraged to start therapy for PTSD   Follow Up Instructions: In 3 months or sooner if needed   I discussed the assessment and treatment plan with the patient. The patient was provided an opportunity to ask questions and all were answered. The patient agreed with the plan and demonstrated an understanding of the instructions.   The patient was advised to call back or seek an in-person evaluation if the symptoms worsen or if the condition fails to improve as anticipated.  I provided 15 minutes of non-face-to-face time during this encounter.   , MD

## 2019-06-06 ENCOUNTER — Telehealth (HOSPITAL_COMMUNITY): Payer: BC Managed Care – PPO | Admitting: Psychiatry

## 2019-06-06 ENCOUNTER — Other Ambulatory Visit: Payer: Self-pay

## 2019-06-06 ENCOUNTER — Encounter (HOSPITAL_COMMUNITY): Payer: Self-pay | Admitting: Psychiatry

## 2019-06-06 DIAGNOSIS — F331 Major depressive disorder, recurrent, moderate: Secondary | ICD-10-CM

## 2019-06-06 DIAGNOSIS — F411 Generalized anxiety disorder: Secondary | ICD-10-CM

## 2019-06-06 DIAGNOSIS — F4010 Social phobia, unspecified: Secondary | ICD-10-CM

## 2019-06-06 DIAGNOSIS — F4310 Post-traumatic stress disorder, unspecified: Secondary | ICD-10-CM

## 2019-06-06 MED ORDER — BUPROPION HCL ER (SR) 200 MG PO TB12: ORAL_TABLET | ORAL | 0 refills | Status: DC

## 2019-06-06 MED ORDER — BUSPIRONE HCL 10 MG PO TABS: 20.0000 mg | ORAL_TABLET | Freq: Two times a day (BID) | ORAL | 0 refills | Status: DC

## 2019-06-06 NOTE — Progress Notes (Signed)
Virtual Visit via Telephone Note  I connected with Olivia Woodard on 06/06/19 at  3:30 PM EDT by telephone and verified that I am speaking with the correct person using two identifiers.  Location: Patient: home Provider: office   I discussed the limitations, risks, security and privacy concerns of performing an evaluation and management service by telephone and the availability of in person appointments. I also discussed with the patient that there may be a patient responsible charge related to this service. The patient expressed understanding and agreed to proceed.   History of Present Illness: "doing good. everything is going well".   School finished yesterday she retained her GPA. Working is going well. She is planning on doing some travel. Mood is good and her depression is manageable.. It helped to decrease her class load and use time management. Her sleep is generally poor. Her anxiety is ongoing. She continues to pick or bite her nails. For now her PTSD is manageable and she has not encounted any triggers. She denies SI/HI.   Observations/Objective:  General Appearance: unable to assess  Eye Contact:  unable to assess  Speech:  Clear and Coherent and Normal Rate  Volume:  Normal  Mood:  Anxious and Depressed  Affect:  Full Range  Thought Process:  Goal Directed, Linear, and Descriptions of Associations: Intact  Orientation:  Full (Time, Place, and Person)  Thought Content:  Logical  Suicidal Thoughts:  No  Homicidal Thoughts:  No  Memory:  Immediate;   Good  Judgement:  Good  Insight:  Good  Psychomotor Activity: unable to assess  Concentration:  Concentration: Good  Recall:  Good  Fund of Knowledge:  Good  Language:  Good  Akathisia:  unable to assess  Handed:  Right  AIMS (if indicated):     Assets:  Communication Skills Desire for Improvement Financial Resources/Insurance Housing Resilience Social  Support Talents/Skills Transportation Vocational/Educational  ADL's:  unable to assess  Cognition:  WNL  Sleep:         Assessment and Plan:  Increase Buspar 20mg  po BID  Wellbutrin SR 200mg  po BID  Follow Up Instructions: In 2-3 months or sooner if needed   I discussed the assessment and treatment plan with the patient. The patient was provided an opportunity to ask questions and all were answered. The patient agreed with the plan and demonstrated an understanding of the instructions.   The patient was advised to call back or seek an in-person evaluation if the symptoms worsen or if the condition fails to improve as anticipated.  I provided 20 minutes of non-face-to-face time during this encounter.   Charlcie Cradle, MD

## 2019-08-29 ENCOUNTER — Other Ambulatory Visit: Payer: Self-pay

## 2019-08-29 ENCOUNTER — Telehealth (INDEPENDENT_AMBULATORY_CARE_PROVIDER_SITE_OTHER): Payer: BC Managed Care – PPO | Admitting: Psychiatry

## 2019-08-29 DIAGNOSIS — F4310 Post-traumatic stress disorder, unspecified: Secondary | ICD-10-CM

## 2019-08-29 DIAGNOSIS — F411 Generalized anxiety disorder: Secondary | ICD-10-CM | POA: Diagnosis not present

## 2019-08-29 DIAGNOSIS — F4010 Social phobia, unspecified: Secondary | ICD-10-CM

## 2019-08-29 DIAGNOSIS — F331 Major depressive disorder, recurrent, moderate: Secondary | ICD-10-CM

## 2019-08-29 MED ORDER — BUSPIRONE HCL 30 MG PO TABS: 30.0000 mg | ORAL_TABLET | Freq: Two times a day (BID) | ORAL | 0 refills | Status: DC

## 2019-08-29 MED ORDER — BUPROPION HCL ER (SR) 200 MG PO TB12: ORAL_TABLET | ORAL | 0 refills | Status: DC

## 2019-08-29 NOTE — Progress Notes (Signed)
Virtual Visit via Telephone Note  I connected with Olivia Woodard on 08/29/19 at  4:30 PM EDT by telephone and verified that I am speaking with the correct person using two identifiers.  Location: Patient: home Provider: office   I discussed the limitations, risks, security and privacy concerns of performing an evaluation and management service by telephone and the availability of in person appointments. I also discussed with the patient that there may be a patient responsible charge related to this service. The patient expressed understanding and agreed to proceed.   History of Present Illness: Olivia Woodard has a number of stressors going on. She feels she is handling it as best she can. Her anxiety has increased. She is not able to focus and is easily overwhelmed.  Olivia Woodard sleep has improved. October is coming quickly and she is worried about the prison release. She has a lot more dreams, anxiety and intrusive memories. Her HV has increased mildly. Her depression and anxiety are tied. She is feeling less motivated and wanting to spend more time in bed. She denies SI/HI.    Observations/Objective:  General Appearance: unable to assess  Eye Contact:  unable to assess  Speech:  Clear and Coherent and Normal Rate  Volume:  Normal  Mood:  Anxious and Depressed  Affect:  Congruent  Thought Process:  Goal Directed, Linear and Descriptions of Associations: Intact  Orientation:  Full (Time, Place, and Person)  Thought Content:  Logical  Suicidal Thoughts:  No  Homicidal Thoughts:  No  Memory:  Immediate;   Good  Judgement:  Good  Insight:  Good  Psychomotor Activity: unable to assess  Concentration:  Concentration: Good  Recall:  Good  Fund of Knowledge:  Good  Language:  Good  Akathisia:  unable to assess  Handed:  Right  AIMS (if indicated):     Assets:  Communication Skills Desire for Improvement Financial Resources/Insurance Housing Intimacy Resilience Social  Support Talents/Skills Transportation Vocational/Educational  ADL's:  unable to assess  Cognition:  WNL  Sleep:         Assessment and Plan:  GAD; PTSD; MDD- recurrent, moderate; Social anxiety disorder  Increase Buspar 30mg  po BID  Wellbutrin SR 200mg  po BID  She is looking for a therapist  Follow Up Instructions: In 2-3 months or sooner if needed   I discussed the assessment and treatment plan with the patient. The patient was provided an opportunity to ask questions and all were answered. The patient agreed with the plan and demonstrated an understanding of the instructions.   The patient was advised to call back or seek an in-person evaluation if the symptoms worsen or if the condition fails to improve as anticipated.  I provided 15 minutes of non-face-to-face time during this encounter.   , MD

## 2019-10-17 ENCOUNTER — Telehealth (INDEPENDENT_AMBULATORY_CARE_PROVIDER_SITE_OTHER): Payer: BC Managed Care – PPO | Admitting: Psychiatry

## 2019-10-17 ENCOUNTER — Other Ambulatory Visit: Payer: Self-pay

## 2019-10-17 DIAGNOSIS — F331 Major depressive disorder, recurrent, moderate: Secondary | ICD-10-CM | POA: Diagnosis not present

## 2019-10-17 DIAGNOSIS — F411 Generalized anxiety disorder: Secondary | ICD-10-CM

## 2019-10-17 DIAGNOSIS — F4010 Social phobia, unspecified: Secondary | ICD-10-CM

## 2019-10-17 DIAGNOSIS — F4310 Post-traumatic stress disorder, unspecified: Secondary | ICD-10-CM | POA: Diagnosis not present

## 2019-10-17 MED ORDER — BUSPIRONE HCL 30 MG PO TABS: 30.0000 mg | ORAL_TABLET | Freq: Two times a day (BID) | ORAL | 0 refills | Status: DC

## 2019-10-17 MED ORDER — BUPROPION HCL ER (SR) 200 MG PO TB12: ORAL_TABLET | ORAL | 0 refills | Status: DC

## 2019-10-17 NOTE — Progress Notes (Signed)
Virtual Visit via Telephone Note  I connected with [name] on 10/17/19 at  4:30 PM EDT by telephone and verified that I am speaking with the correct person using two identifiers.  Location: Patient: home Provider: office   I discussed the limitations, risks, security and privacy concerns of performing an evaluation and management service by telephone and the availability of in person appointments. I also discussed with the patient that there may be a patient responsible charge related to this service. The patient expressed understanding and agreed to proceed.   History of Present Illness: Korene notes that her anxiety has improved some. It is more manageable. Her PTSD symptoms are not as intense. She is not thinking about the trauma as much. Kamber also notes the depression has improved a little. She has made some decisions and let go of some things. Her sleep is good. She is able to fall asleep quickly. She is no longer biting or picking her nails. Shaylie denies SI/HI.    General Appearance: unable to assess  Eye Contact:  unable to assess  Speech:  Clear and Coherent and Normal Rate  Volume:  Normal  Mood:  Anxious and Depressed  Affect:  Full Range  Thought Process:  Goal Directed, Linear and Descriptions of Associations: Intact  Orientation:  Full (Time, Place, and Person)  Thought Content:  Logical  Suicidal Thoughts:  No  Homicidal Thoughts:  No  Memory:  Immediate;   Good  Judgement:  Good  Insight:  Good  Psychomotor Activity: unable to assess  Concentration:  Concentration: Good  Recall:  Good  Fund of Knowledge:  Good  Language:  Good  Akathisia:  unable to assess  Handed:  Right  AIMS (if indicated):     Assets:  Communication Skills Desire for Improvement Financial Resources/Insurance Housing Leisure Time Physical Health Resilience Social Support Talents/Skills Transportation Vocational/Educational  ADL's:  unable to assess  Cognition:  WNL   Sleep:         Assessment and Plan: GAD; MDD- recurrent, moderate; PTSD; Social anxiety disorder  Buspar 30mg  po BID  Wellbutrin SR 200mg  po BID  She has found a therapist and hopes to start therapy soon.   Follow Up Instructions: In 3 months or sooner if needed   I discussed the assessment and treatment plan with the patient. The patient was provided an opportunity to ask questions and all were answered. The patient agreed with the plan and demonstrated an understanding of the instructions.   The patient was advised to call back or seek an in-person evaluation if the symptoms worsen or if the condition fails to improve as anticipated.  I provided 15 minutes of non-face-to-face time during this encounter.   , MD

## 2020-03-12 ENCOUNTER — Telehealth (INDEPENDENT_AMBULATORY_CARE_PROVIDER_SITE_OTHER): Payer: BC Managed Care – PPO | Admitting: Psychiatry

## 2020-03-12 ENCOUNTER — Other Ambulatory Visit: Payer: Self-pay

## 2020-03-12 DIAGNOSIS — F331 Major depressive disorder, recurrent, moderate: Secondary | ICD-10-CM | POA: Diagnosis not present

## 2020-03-12 DIAGNOSIS — F4310 Post-traumatic stress disorder, unspecified: Secondary | ICD-10-CM

## 2020-03-12 DIAGNOSIS — F411 Generalized anxiety disorder: Secondary | ICD-10-CM

## 2020-03-12 NOTE — Progress Notes (Signed)
Virtual Visit via Telephone Note  I connected with Olivia Woodard on 03/12/20 at  3:30 PM EST by telephone and verified that I am speaking with the correct person using two identifiers. We attempted to connect by video but kept getting an error messages so opted to continue by phone.  Location: Patient: work Provider: office   I discussed the limitations, risks, security and privacy concerns of performing an evaluation and management service by telephone and the availability of in person appointments. I also discussed with the patient that there may be a patient responsible charge related to this service. The patient expressed understanding and agreed to proceed.   History of Present Illness: Olivia Woodard shares that is [redacted] weeks pregnant. Her 1st appointment with OB is on 2/17.  She had accidentally been off her meds for 1 week prior to learning she was pregnant. So by her calculation she has been off meds for 5 weeks. Over the last one month she has been engaging in therapy and it is helpful. Olivia Woodard states that her depression and anxiety are manageable. Her PTSD is stable and therapy is helping a lot in regards to all her anxiety. Olivia Woodard is sleeping and eating well. She denies anhedonia and hopelessness. Overall she is happy. She denies SI/HI.    Observations/Objective:  General Appearance: unable to assess  Eye Contact:  unable to assess  Speech:  Clear and Coherent and Normal Rate  Volume:  Normal  Mood:  Euthymic  Affect:  Full Range  Thought Process:  Goal Directed, Linear and Descriptions of Associations: Intact  Orientation:  Full (Time, Place, and Person)  Thought Content:  Logical  Suicidal Thoughts:  No  Homicidal Thoughts:  No  Memory:  Immediate;   Good  Judgement:  Good  Insight:  Good  Psychomotor Activity: unable to assess  Concentration:  Concentration: Good  Recall:  Good  Fund of Knowledge:  Good  Language:  Good  Akathisia:  unable to assess  Handed:  Right  AIMS  (if indicated):     Assets:  Communication Skills Desire for Improvement Financial Resources/Insurance Housing Intimacy Leisure Time Resilience Social Support Talents/Skills Transportation Vocational/Educational  ADL's:  unable to assess  Cognition:  WNL  Sleep:         Assessment and Plan:  1. MDD (major depressive disorder), recurrent episode, moderate (HCC)  2. GAD (generalized anxiety disorder)  3. PTSD (post-traumatic stress disorder)  - encouraged to continue therapy as pt is reporting benefit Pt is currently pregnant and prefers to stop all medications.  D/c Wellbutrin D/c Buspar  We discussed plan for monitoring symptoms and potential use of medication if needed. Olivia Woodard verbalized understanding.   Follow Up Instructions: In 2-3 months or sooner if needed   I discussed the assessment and treatment plan with the patient. The patient was provided an opportunity to ask questions and all were answered. The patient agreed with the plan and demonstrated an understanding of the instructions.   The patient was advised to call back or seek an in-person evaluation if the symptoms worsen or if the condition fails to improve as anticipated.  I provided 15 minutes of non-face-to-face time during this encounter.   Oletta Darter, MD

## 2020-05-28 ENCOUNTER — Telehealth (INDEPENDENT_AMBULATORY_CARE_PROVIDER_SITE_OTHER): Payer: BC Managed Care – PPO | Admitting: Psychiatry

## 2020-05-28 ENCOUNTER — Other Ambulatory Visit: Payer: Self-pay

## 2020-05-28 DIAGNOSIS — F411 Generalized anxiety disorder: Secondary | ICD-10-CM | POA: Diagnosis not present

## 2020-05-28 DIAGNOSIS — F4310 Post-traumatic stress disorder, unspecified: Secondary | ICD-10-CM | POA: Diagnosis not present

## 2020-05-28 DIAGNOSIS — F331 Major depressive disorder, recurrent, moderate: Secondary | ICD-10-CM | POA: Diagnosis not present

## 2020-05-28 DIAGNOSIS — F4010 Social phobia, unspecified: Secondary | ICD-10-CM | POA: Diagnosis not present

## 2020-05-28 NOTE — Progress Notes (Signed)
Virtual Visit via Video Note  I connected with Olivia Woodard on 05/28/20 at  1:00 PM EDT by a video enabled telemedicine application and verified that I am speaking with the correct person using two identifiers.  Location: Patient: work [employer]: office   I discussed the limitations of evaluation and management by telemedicine and the availability of in person appointments. The patient expressed understanding and agreed to proceed.  History of Present Illness: Olivia Woodard has miscarriage in March. She admits to feeling depressed and have some anhedonia. Her sleep is getting better over the last 10 days. She made a sleep schedule and is sticking to it. Olivia Woodard has low motivation but has been able to force herself to get up and go to work. Work has helped. She denies SI/HI.  Her anxiety has been up and down. It is hard to be around people who are pregnant. She is trying to get pregnant again and it is making her anxious. Her social anxiety is mildly increased at work. Her PTSD is mild and she has not noticed any symptoms lately. Therapy is helping with all her depression, anxiety and PTSD symptoms.    Observations/Objective: Psychiatric Specialty Exam: ROS  There were no vitals taken for this visit.There is no height or weight on file to calculate BMI.  General Appearance: Casual and Fairly Groomed  Eye Contact:  Good  Speech:  Clear and Coherent and Normal Rate  Volume:  Normal  Mood:  Depressed  Affect:  Full Range  Thought Process:  Goal Directed, Linear and Descriptions of Associations: Intact  Orientation:  Full (Time, Place, and Person)  Thought Content:  Logical  Suicidal Thoughts:  No  Homicidal Thoughts:  No  Memory:  Immediate;   Good  Judgement:  Good  Insight:  Good  Psychomotor Activity:  Normal  Concentration:  Concentration: Good  Recall:  Good  Fund of Knowledge:  Good  Language:  Good  Akathisia:  No  Handed:  Right  AIMS (if indicated):     Assets:   Communication Skills Desire for Improvement Financial Resources/Insurance Housing Intimacy Leisure Time Physical Health Resilience Social Support Talents/Skills Transportation Vocational/Educational  ADL's:  Intact  Cognition:  WNL  Sleep:        Assessment and Plan: Depression screen [facility] 2/9 05/28/2020  Decreased Interest 2  Down, Depressed, Hopeless 2  PHQ - 2 Score 4  Altered sleeping 2  Tired, decreased energy 2  Change in appetite 0  Feeling bad or failure about yourself  0  Trouble concentrating 0  Moving slowly or fidgety/restless 0  Suicidal thoughts 0  PHQ-9 Score 8  Difficult doing work/chores Somewhat difficult   Flowsheet Row Video Visit from 05/28/2020 in [facility]  C-SSRS RISK CATEGORY No Risk     - Yi is trying to get pregnant and does not want to be on any medications. She feels she is managing her symptoms well.  1. MDD (major depressive disorder), recurrent episode, moderate (HCC)  2. GAD (generalized anxiety disorder)  3. Social anxiety disorder  4. PTSD (post-traumatic stress disorder)    Follow Up Instructions: In 2-3 months or sooner if needed   I discussed the assessment and treatment plan with the patient. The patient was provided an opportunity to ask questions and all were answered. The patient agreed with the plan and demonstrated an understanding of the instructions.   The patient was advised to call back or seek an in-person evaluation if the  symptoms worsen or if the condition fails to improve as anticipated.   Olivia Darter, MD

## 2020-06-04 IMAGING — US US BREAST*L* LIMITED INC AXILLA
1 series · 13 of 13 positions shown · non-contrast
Comparison: Previous exam(s).

CLINICAL DATA: Patient presents with a palpable area of concern in
the left breast felt by her doctor.

EXAM:
ULTRASOUND OF THE LEFT BREAST

[Series 1: us breast*left* limited inc axilla · 0.06mm/px · 13 of 13 slices shown]
[im 1/13]
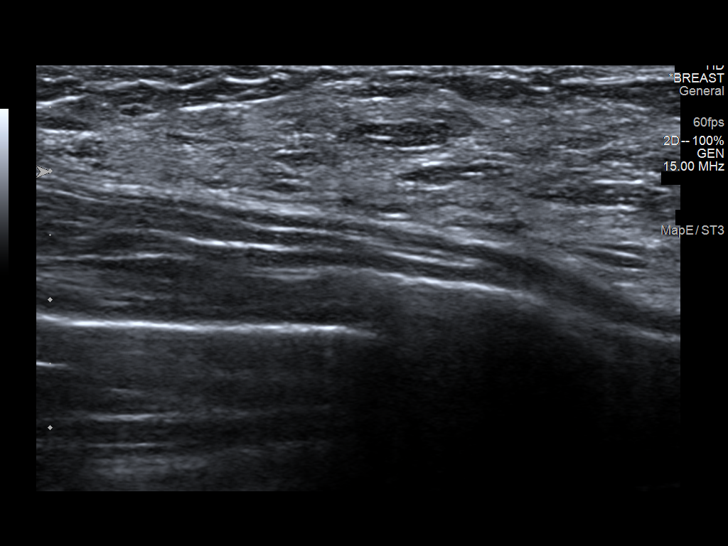
[im 2/13]
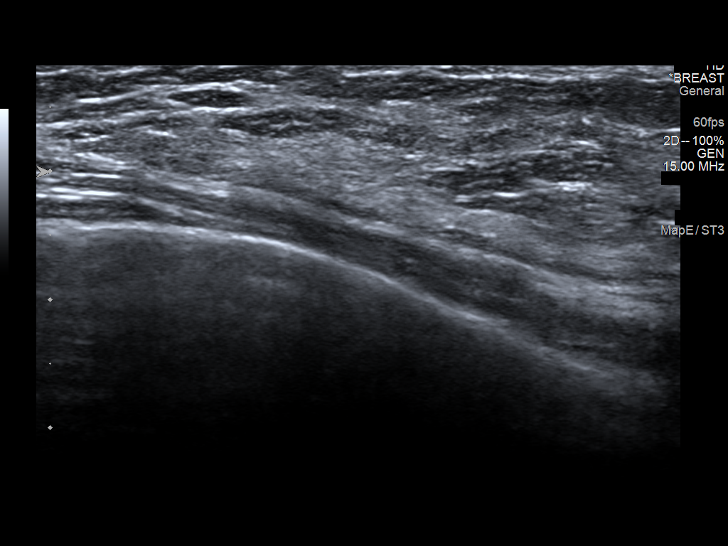
[im 3/13]
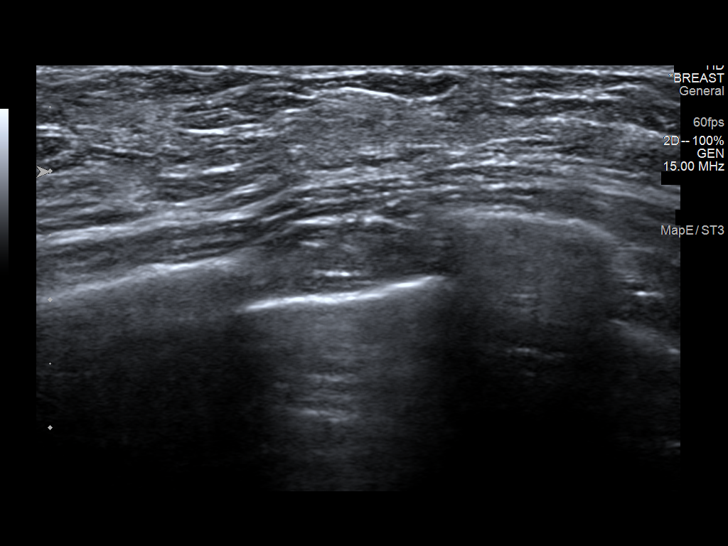
[im 4/13]
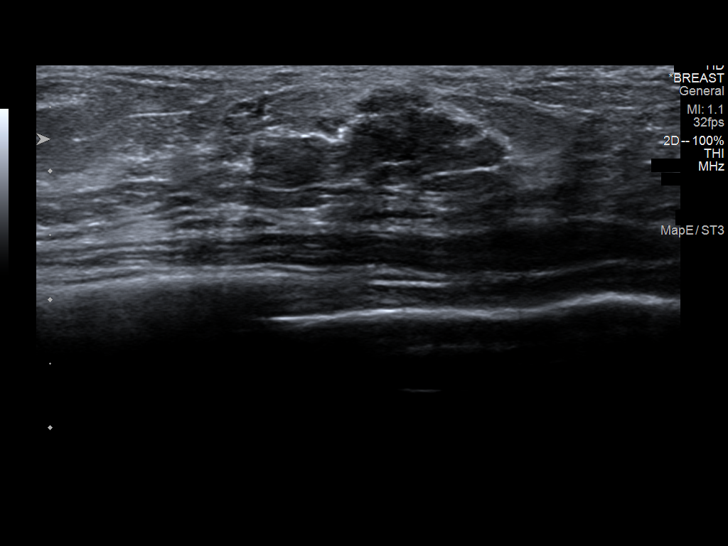
[im 5/13]
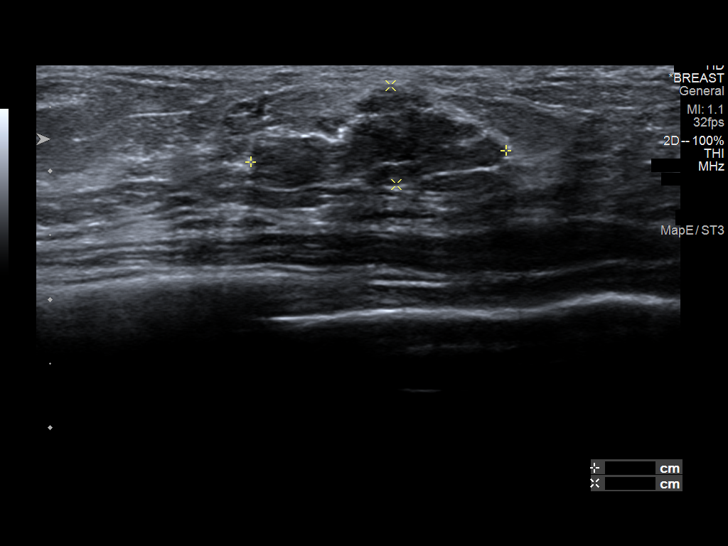
[im 6/13]
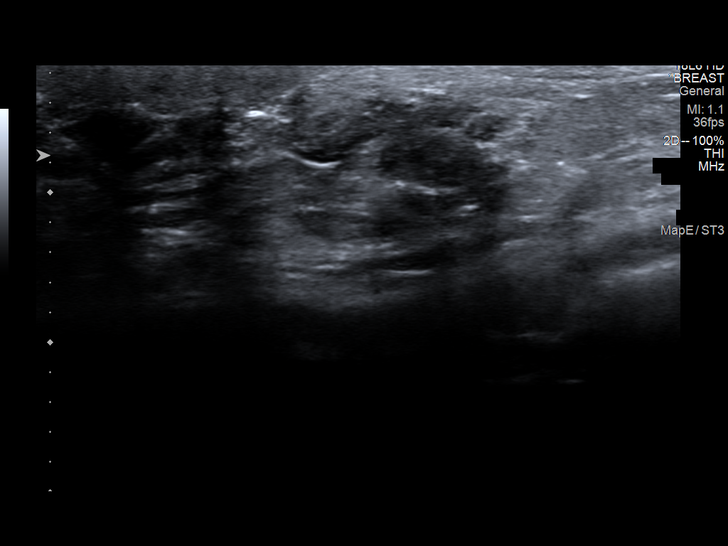
[im 7/13]
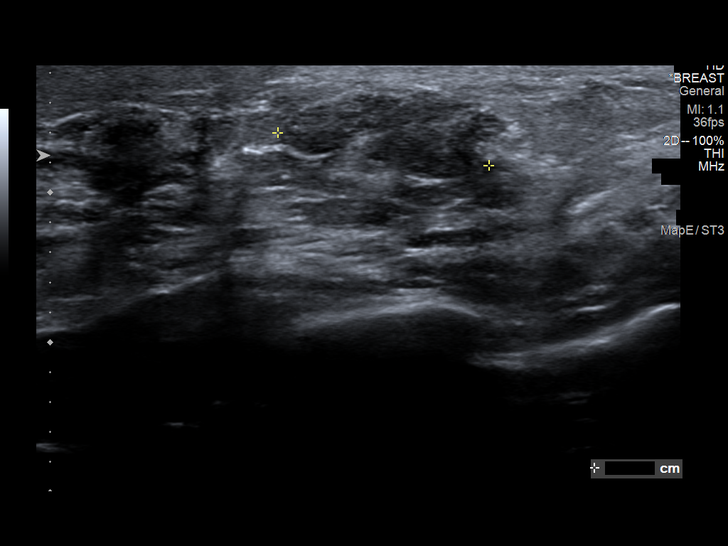
[im 8/13]
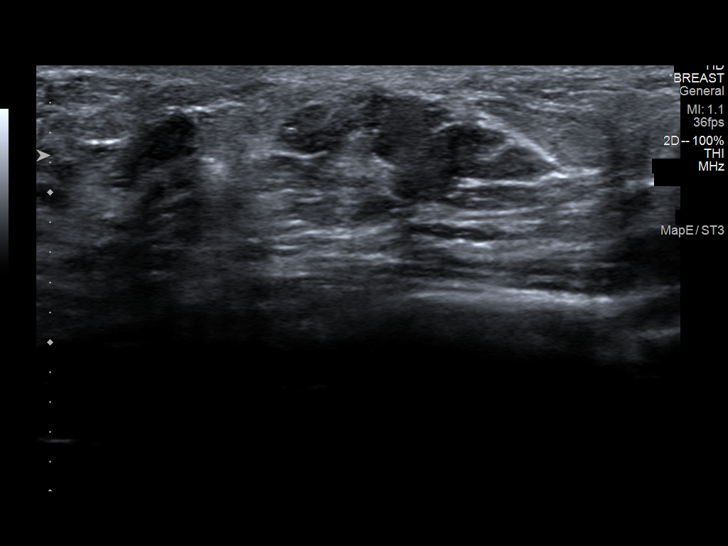
[im 9/13]
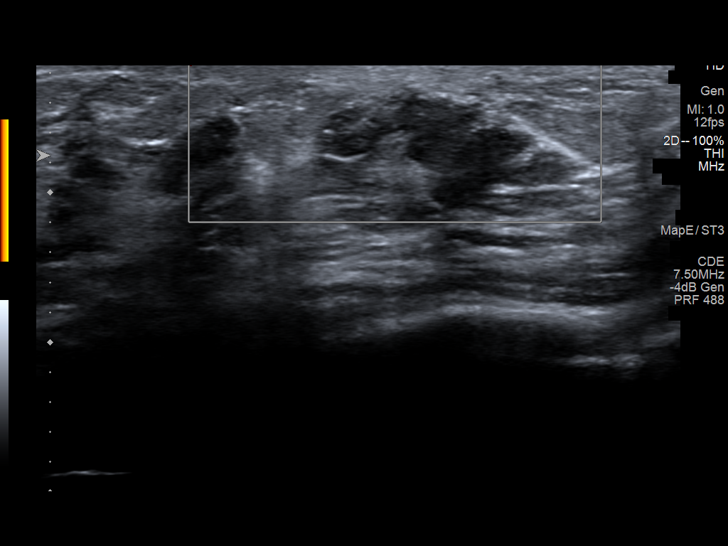
[im 10/13]
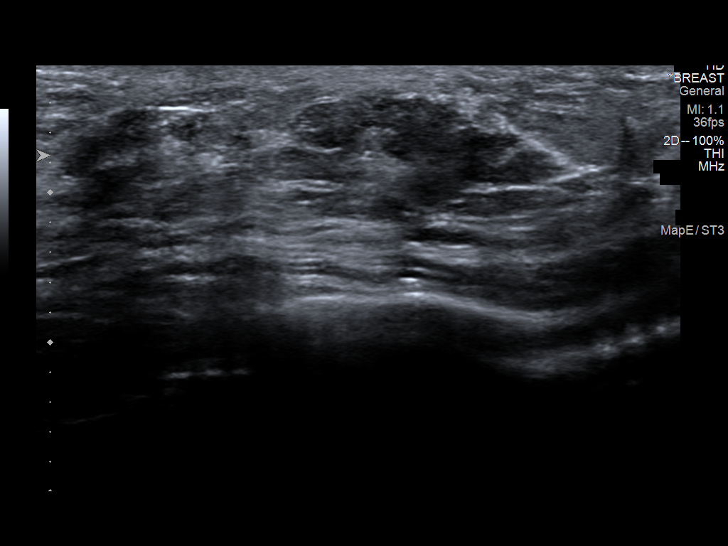
[im 11/13]
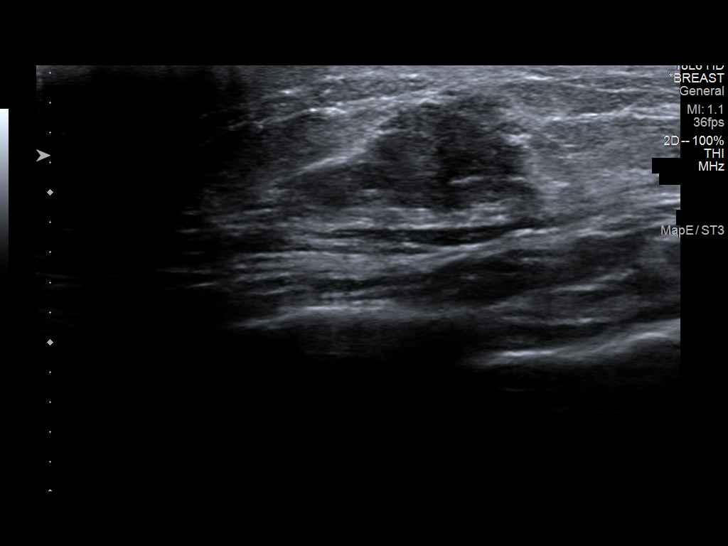
[im 12/13]
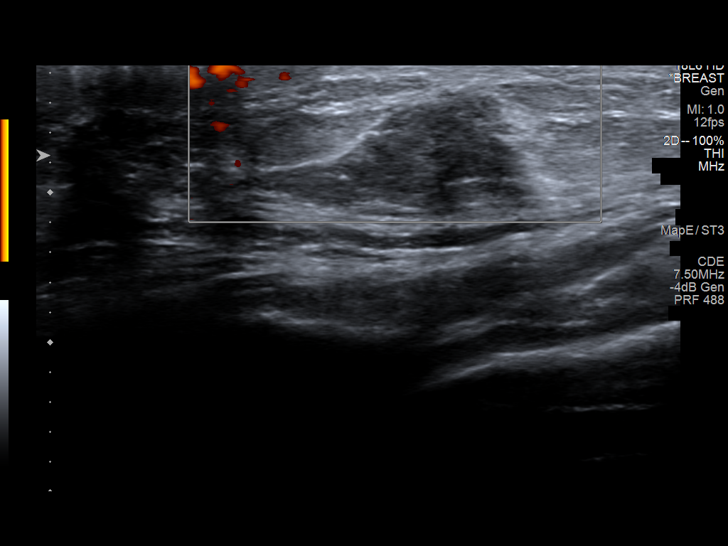
[im 13/13]
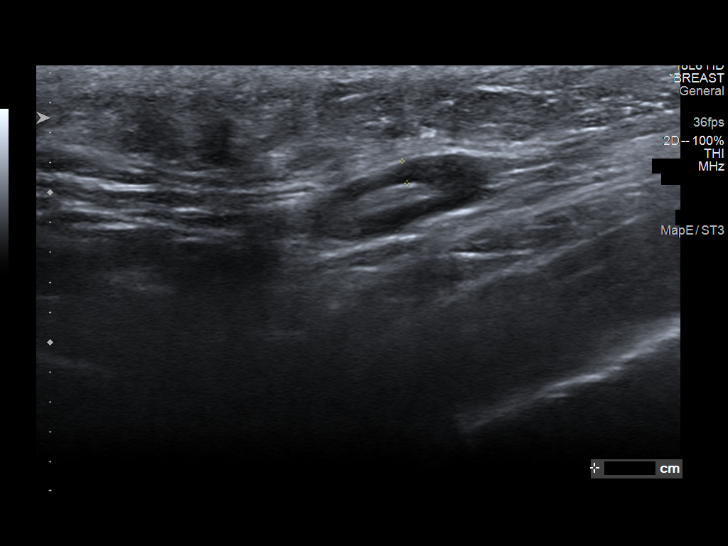

[13 of 13 positions shown; findings below may reference images not displayed]

FINDINGS: On physical exam, I palpate a firm mass like area in the lower outer
periareolar left breast.

Targeted ultrasound is performed at 4 o'clock 1 cm from the nipple
demonstrating an irregular masslike area with lobulation measuring
2.0 x 0.7 x 1.4 cm, that appears different from the patient's other
breast tissue. No definite internal blood flow.

Targeted ultrasound of the left axilla demonstrates normal-appearing
lymph nodes.
IMPRESSION: Left breast 2.0 cm mass-like area is indeterminate, tissue sampling
is recommended. Differential considerations include atypical
fibroadenoma, PASH, dense breast tissue.

RECOMMENDATION:
Ultrasound-guided core needle biopsy of the left breast.

I have discussed the findings and recommendations with the patient.
If applicable, a reminder letter will be sent to the patient
regarding the next appointment.

BI-RADS CATEGORY  4: Suspicious.

## 2020-09-03 ENCOUNTER — Telehealth (INDEPENDENT_AMBULATORY_CARE_PROVIDER_SITE_OTHER): Payer: BC Managed Care – PPO | Admitting: Psychiatry

## 2020-09-03 ENCOUNTER — Other Ambulatory Visit: Payer: Self-pay

## 2020-09-03 DIAGNOSIS — F411 Generalized anxiety disorder: Secondary | ICD-10-CM | POA: Diagnosis not present

## 2020-09-03 DIAGNOSIS — F331 Major depressive disorder, recurrent, moderate: Secondary | ICD-10-CM

## 2020-09-03 DIAGNOSIS — F4010 Social phobia, unspecified: Secondary | ICD-10-CM

## 2020-09-03 NOTE — Progress Notes (Signed)
Virtual Visit via Video Note  I connected with Olivia Woodard on 09/03/20 at 10:30 AM EDT by a video enabled telemedicine application and verified that I am speaking with the correct person using two identifiers.  Location: Patient: home Provider: office   I discussed the limitations of evaluation and management by telemedicine and the availability of in person appointments. The patient expressed understanding and agreed to proceed.  History of Present Illness: Olivia Woodard is [redacted] weeks pregnant. She has ongoing depression but is manageable. Olivia Woodard is more tired and her motivation is low. She is still grieving and sad mood. Olivia Woodard is not sleeping well and it was not good during her previous pregnancy as well. She lost her previous pregnancy at 8 weeks. She is working on getting into routine and schedule. She is starting work again on the 16th. Her general and social anxiety are a little worse. She does not want to go out and has been isolating a little more. Olivia Woodard feels general anxiety is appropriate due to her current stressors. Her PTSD is not bothering her right now. She denies SI/HI. She is not taking any medication other than her a prenatal vitamin. She does not want to take any medication right now and prefers to wait until her second trimester. She is managing her symptoms in therapy.    Observations/Objective: Psychiatric Specialty Exam: ROS  There were no vitals taken for this visit.There is no height or weight on file to calculate BMI.  General Appearance: Casual and Neat  Eye Contact:  Good  Speech:  Clear and Coherent and Normal Rate  Volume:  Normal  Mood:  Anxious and Depressed  Affect:  Full Range  Thought Process:  Goal Directed, Linear, and Descriptions of Associations: Intact  Orientation:  Full (Time, Place, and Person)  Thought Content:  Logical  Suicidal Thoughts:  No  Homicidal Thoughts:  No  Memory:  Immediate;   Good  Judgement:  Good  Insight:  Good  Psychomotor  Activity:  Normal  Concentration:  Concentration: Good  Recall:  Good  Fund of Knowledge:  Good  Language:  Good  Akathisia:  No  Handed:  Right  AIMS (if indicated):     Assets:  Communication Skills Desire for Improvement Financial Resources/Insurance Housing Intimacy Leisure Time Resilience Social Support Talents/Skills Transportation Vocational/Educational  ADL's:  Intact  Cognition:  WNL  Sleep:        Assessment and Plan: Olivia Woodard is currently 6 weeks pregnancy  She is in therapy and it is helping manage her mental health symptoms. Olivia Woodard does not want to take any meds at this point in time but will consider if needed in her 2nd trimester.   MDD (major depressive disorder), recurrent episode, moderate (HCC)  GAD (generalized anxiety disorder)  Social anxiety disorder   Follow Up Instructions: In 4-6 weeks or sooner if needed   I discussed the assessment and treatment plan with the patient. The patient was provided an opportunity to ask questions and all were answered. The patient agreed with the plan and demonstrated an understanding of the instructions.   The patient was advised to call back or seek an in-person evaluation if the symptoms worsen or if the condition fails to improve as anticipated.  I provided 11 minutes of non-face-to-face time during this encounter.   Oletta Darter, MD

## 2020-10-15 ENCOUNTER — Other Ambulatory Visit: Payer: Self-pay

## 2020-10-15 ENCOUNTER — Telehealth (INDEPENDENT_AMBULATORY_CARE_PROVIDER_SITE_OTHER): Payer: BC Managed Care – PPO | Admitting: Psychiatry

## 2020-10-15 DIAGNOSIS — F4010 Social phobia, unspecified: Secondary | ICD-10-CM | POA: Diagnosis not present

## 2020-10-15 DIAGNOSIS — F4310 Post-traumatic stress disorder, unspecified: Secondary | ICD-10-CM | POA: Diagnosis not present

## 2020-10-15 DIAGNOSIS — F331 Major depressive disorder, recurrent, moderate: Secondary | ICD-10-CM

## 2020-10-15 DIAGNOSIS — F411 Generalized anxiety disorder: Secondary | ICD-10-CM

## 2020-10-15 NOTE — Progress Notes (Signed)
Virtual Visit via Telephone Note  I connected with Olivia Woodard on 10/15/20 at 10:30 AM EDT by telephone and verified that I am speaking with the correct person using two identifiers. We were unable to connect by video as her phone was not working well.   Location: Patient: home Provider: office   I discussed the limitations, risks, security and privacy concerns of performing an evaluation and management service by telephone and the availability of in person appointments. I also discussed with the patient that there may be a patient responsible charge related to this service. The patient expressed understanding and agreed to proceed.   History of Present Illness: Olivia Woodard is struggling with some depression. She is working with her OB and plans to discuss treatment options with her. She has an appointment on Sept 20 and is currently [redacted] weeks pregnant. Her depression comes on about 3-4 days a week. She notes that she doesn't feel happy and feels "miserable". Olivia Woodard wishes she could be as happy as she was 1 yr ago. Olivia Woodard is having some anhedonia and her motivation is low. It is getting a little better lately. Her sleep is not great and she has some nightmares. Her energy is low. Her appetite is normal. She struggles with focus and it takes more time to do things. Olivia Woodard denies SI/HI. Overall her PTSD is well controlled with support from her family. She remains anxious socially and it is contributing to her depression.    Observations/Objective:  General Appearance: unable to assess  Eye Contact:  unable to assess  Speech:  Clear and Coherent and Normal Rate  Volume:  Normal  Mood:  Anxious and Depressed  Affect:  Full Range  Thought Process:  Goal Directed, Linear, and Descriptions of Associations: Intact  Orientation:  Full (Time, Place, and Person)  Thought Content:  Logical  Suicidal Thoughts:  No  Homicidal Thoughts:  No  Memory:  Immediate;   Good  Judgement:  Good  Insight:  Good   Psychomotor Activity: unable to assess  Concentration:  Concentration: Good  Recall:  Good  Fund of Knowledge:  Good  Language:  Good  Akathisia:  unable to assess  Handed:  unable to assess  AIMS (if indicated):     Assets:  Communication Skills Desire for Improvement Financial Resources/Insurance Housing Intimacy Leisure Time Physical Health Resilience Social Support Talents/Skills Transportation Vocational/Educational  ADL's:  unable to assess  Cognition:  WNL  Sleep:        Assessment and Plan: Depression screen [facility] 2/9 10/15/2020 05/28/2020  Decreased Interest 2 2  Down, Depressed, Hopeless 1 2  PHQ - 2 Score 3 4  Altered sleeping 3 2  Tired, decreased energy 3 2  Change in appetite 0 0  Feeling bad or failure about yourself  2 0  Trouble concentrating 1 0  Moving slowly or fidgety/restless 0 0  Suicidal thoughts 0 0  PHQ-9 Score 12 8  Difficult doing work/chores Somewhat difficult Somewhat difficult    Flowsheet Row Video Visit from 10/15/2020 in [facility] Video Visit from 05/28/2020 in [facility]  C-SSRS RISK CATEGORY No Risk No Risk      - she would like to manage her mood and anxiety without meds but will discuss with OB  - potential options with lower risk of negative effect on fetus are Zoloft or Wellbutrin   1. MDD (major depressive disorder), recurrent episode, moderate (HCC)  2. GAD (generalized anxiety disorder)  3. Social anxiety disorder  4. PTSD (post-traumatic stress disorder)    Follow Up Instructions: In 1 month or sooner if needed   I discussed the assessment and treatment plan with the patient. The patient was provided an opportunity to ask questions and all were answered. The patient agreed with the plan and demonstrated an understanding of the instructions.   The patient was advised to call back or seek an in-person evaluation if the symptoms  worsen or if the condition fails to improve as anticipated.  I provided 12 minutes of non-face-to-face time during this encounter.   Oletta Darter, MD

## 2020-11-12 ENCOUNTER — Other Ambulatory Visit: Payer: Self-pay

## 2020-11-12 ENCOUNTER — Telehealth (HOSPITAL_BASED_OUTPATIENT_CLINIC_OR_DEPARTMENT_OTHER): Payer: BC Managed Care – PPO | Admitting: Psychiatry

## 2020-11-12 DIAGNOSIS — F331 Major depressive disorder, recurrent, moderate: Secondary | ICD-10-CM | POA: Diagnosis not present

## 2020-11-12 DIAGNOSIS — F4310 Post-traumatic stress disorder, unspecified: Secondary | ICD-10-CM | POA: Diagnosis not present

## 2020-11-12 DIAGNOSIS — F411 Generalized anxiety disorder: Secondary | ICD-10-CM

## 2020-11-12 NOTE — Progress Notes (Signed)
Virtual Visit via Video Note  I connected with [name] on 11/12/20 at  4:30 PM EDT by a video enabled telemedicine application and verified that I am speaking with the correct person using two identifiers.  Location: Patient: home Provider: office   I discussed the limitations of evaluation and management by telemedicine and the availability of in person appointments. The patient expressed understanding and agreed to proceed.  History of Present Illness: Domingue is doing "really well". The last few weeks have been good and things are starting to look up. Work is getting better and she has planning her maternity leave. Her pregnancy is going well and she is now in her 2nd trimester. At this point her depression has improved. She has some lack of motivation and interest and fatigue but it is not terrible. Sleep and appetite are good. Her anxiety is much better. Letitia is feeling calmer and more relaxed at work. She is no longer have nightmares. Camrin has rare intrusive memories but still has exaggerated startle.  She denies SI/HI. Gordie does not feel the need for medication at this point but is open to the idea if something changes.    Observations/Objective: Psychiatric Specialty Exam: ROS  There were no vitals taken for this visit.There is no height or weight on file to calculate BMI.  General Appearance: Casual and Neat  Eye Contact:  Good  Speech:  Clear and Coherent and Normal Rate  Volume:  Normal  Mood:  Euthymic  Affect:  Full Range  Thought Process:  Goal Directed, Linear, and Descriptions of Associations: Intact  Orientation:  Full (Time, Place, and Person)  Thought Content:  Logical  Suicidal Thoughts:  No  Homicidal Thoughts:  No  Memory:  Immediate;   Good  Judgement:  Good  Insight:  Good  Psychomotor Activity:  Normal  Concentration:  Concentration: Good  Recall:  Good  Fund of Knowledge:  Good  Language:  Good  Akathisia:  No  Handed:  Right  AIMS (if  indicated):     Assets:  Communication Skills Desire for Improvement Financial Resources/Insurance Housing Intimacy Leisure Time Physical Health Resilience Social Support Talents/Skills Transportation Vocational/Educational  ADL's:  Intact  Cognition:  WNL  Sleep:        Assessment and Plan: Depression screen [facility] 2/9 11/12/2020 10/15/2020 05/28/2020  Decreased Interest 1 2 2   Down, Depressed, Hopeless 1 1 2   PHQ - 2 Score 2 3 4   Altered sleeping 0 3 2  Tired, decreased energy 2 3 2   Change in appetite 0 0 0  Feeling bad or failure about yourself  1 2 0  Trouble concentrating 0 1 0  Moving slowly or fidgety/restless 0 0 0  Suicidal thoughts 0 0 0  PHQ-9 Score 5 12 8   Difficult doing work/chores Somewhat difficult Somewhat difficult Somewhat difficult    Flowsheet Row Video Visit from 11/12/2020 in [facility] Video Visit from 10/15/2020 in [facility] Video Visit from 05/28/2020 in [facility]  C-SSRS RISK CATEGORY No Risk No Risk No Risk      1. MDD (major depressive disorder), recurrent episode, moderate (HCC)  2. GAD (generalized anxiety disorder)  3. PTSD (post-traumatic stress disorder)  - pt is reporting stable and manageable mental health symptoms. She would prefer not to take any meds during her pregnancy. She is engaging in therapy and has regular OB appointments. I will continue to monitor symptoms and make recommendations as  needed  Follow Up Instructions: In 3 months or sooner if needed   I discussed the assessment and treatment plan with the patient. The patient was provided an opportunity to ask questions and all were answered. The patient agreed with the plan and demonstrated an understanding of the instructions.   The patient was advised to call back or seek an in-person evaluation if the symptoms worsen or if the condition fails to  improve as anticipated.  I provided 9 minutes of non-face-to-face time during this encounter.   Oletta Darter, MD

## 2020-12-16 LAB — OB RESULTS CONSOLE ABO/RH: RH Type: POSITIVE

## 2020-12-16 LAB — OB RESULTS CONSOLE PLATELET COUNT: Platelets: 263

## 2020-12-16 LAB — OB RESULTS CONSOLE GC/CHLAMYDIA
Chlamydia: NEGATIVE
Gonorrhea: NEGATIVE

## 2020-12-16 LAB — OB RESULTS CONSOLE ANTIBODY SCREEN: Antibody Screen: NEGATIVE

## 2020-12-16 LAB — OB RESULTS CONSOLE HGB/HCT, BLOOD
HCT: 41 (ref 29–41)
HCT: 41 (ref 29–41)
Hemoglobin: 13.8

## 2020-12-16 LAB — OB RESULTS CONSOLE RPR: RPR: NONREACTIVE

## 2020-12-16 LAB — HEPATITIS C ANTIBODY: HCV Ab: NEGATIVE

## 2020-12-16 LAB — OB RESULTS CONSOLE HEPATITIS B SURFACE ANTIGEN: Hepatitis B Surface Ag: NEGATIVE

## 2020-12-16 LAB — OB RESULTS CONSOLE HIV ANTIBODY (ROUTINE TESTING): HIV: NONREACTIVE

## 2020-12-16 LAB — OB RESULTS CONSOLE RUBELLA ANTIBODY, IGM: Rubella: IMMUNE

## 2021-01-31 NOTE — L&D Delivery Note (Signed)
Delivery Note:  ? ?G1P0 at [redacted]w[redacted]d  ?Admitting diagnosis: Full-term premature rupture of membranes, unspecified duration to onset of labor [O42.92] ?Risks: prolonged ROM 37+ hours ?Onset of labor: 04/13/2021 at 1134 ?IOL/Augmentation: Pitocin, Cytotec, and IP Foley ?ROM: 04/12/2021 at 0400, clear fluid ? ?Complete dilation at 04/13/2021  1715 ?Onset of pushing at 1715 ?FHR second stage Cat I ? ?Analgesia/Anesthesia intrapartum:Epidural  ?Pushing in lithotomy position with CNM and L&D staff support. Husband, Weston Brass, and doula, Olene Craven, present for birth and supportive. ? ?Delivery of a Live born female  ?Birth Weight:  pending ?APGAR: 8, 9 ? ?Newborn Delivery   ?Birth date/time: 04/13/2021 17:41:00 ?Delivery type: Vaginal, Spontaneous ?  ?  ?in cephalic presentation, position OA to ROA. ? ?APGAR:1 min-8 , 5 min-9   ?Nuchal Cord: Yes  x 3 and x 1 around the leg ?Cord double clamped after cessation of pulsation, cut by Weston Brass.  ?Collection of cord blood for typing completed. ?Arterial cord blood sample-No   ? ?Placenta delivered-Spontaneous  with 3 vessels . ?Uterotonics: Pitocin and TXA ?Placenta to mother ?Uterine tone firm  ?Bleeding initially moderate, slowed with fundal massage, TXA given ? ?2nd degree  laceration identified.  ?Episiotomy:None  ?Local analgesia: Lidocaine ? ?Repair: 2-0 and 3-0 Vicryl in usual fashion with excellent hemostasis ?Est. Blood Loss (mL):350.00   ?Complications: None ? ?Mom to postpartum.  Baby Vena [place] to Couplet care / Skin to Skin. ? ?Delivery Report: ? ?Review the Delivery Report for details.   ? ?Clancy Gourd, MSN ?04/13/2021, 6:52 PM  ?

## 2021-02-11 LAB — OB RESULTS CONSOLE RPR: RPR: NONREACTIVE

## 2021-02-18 ENCOUNTER — Other Ambulatory Visit: Payer: Self-pay

## 2021-02-18 ENCOUNTER — Telehealth (HOSPITAL_BASED_OUTPATIENT_CLINIC_OR_DEPARTMENT_OTHER): Payer: BC Managed Care – PPO | Admitting: Psychiatry

## 2021-02-18 DIAGNOSIS — F331 Major depressive disorder, recurrent, moderate: Secondary | ICD-10-CM | POA: Diagnosis not present

## 2021-02-18 DIAGNOSIS — F411 Generalized anxiety disorder: Secondary | ICD-10-CM

## 2021-02-18 DIAGNOSIS — F4310 Post-traumatic stress disorder, unspecified: Secondary | ICD-10-CM | POA: Diagnosis not present

## 2021-02-18 NOTE — Progress Notes (Signed)
Virtual Visit via Video Note  I connected with Olivia Woodard on 02/18/21 at  4:30 PM EST by a video enabled telemedicine application and verified that I am speaking with the correct person using two identifiers.  Location: Patient: home Provider: office   I discussed the limitations of evaluation and management by telemedicine and the availability of in person appointments. The patient expressed understanding and agreed to proceed.  History of Present Illness: Olivia Woodard states she is doing well. Her depression has improved since October. She struggled a little over the holidays. The month of January has been good and she denies any depression symptoms. She has been motivated and active. She denies anhedonia, isolation, hopelessness and worthlessness. It can take her a couple of hours to fall asleep but once she is asleep then she stays asleep. She gets about 7 hrs most nights. She rarely naps during the day. Her appetite is good. She denies SI/IHI. Her anxiety comes and goes and sometimes it is a struggle. She is still in therapy every other week and that helps. Distraction and talking about the anxiety inducing stress helps a lot. Her PTSD is stable. She had some intrusive memories briefly with some triggers but has not had any long effects. She denies nightmares, flashbacks and nightmares. The HV is ongoing but not changed intensity. Her pregnancy is going well and she is seeing her OB every 2-3 weeks.     Observations/Objective: Psychiatric Specialty Exam: ROS  There were no vitals taken for this visit.There is no height or weight on file to calculate BMI.  General Appearance: Casual  Eye Contact:  Good  Speech:  Clear and Coherent and Normal Rate  Volume:  Normal  Mood:  Euthymic  Affect:  Full Range  Thought Process:  Goal Directed, Linear, and Descriptions of Associations: Intact  Orientation:  Full (Time, Place, and Person)  Thought Content:  Logical  Suicidal Thoughts:  No   Homicidal Thoughts:  No  Memory:  Immediate;   Good  Judgement:  Good  Insight:  Good  Psychomotor Activity:  Normal  Concentration:  Concentration: Good  Recall:  Good  Fund of Knowledge:  Good  Language:  Good  Akathisia:  No  Handed:  Right  AIMS (if indicated):     Assets:  Communication Skills Desire for Improvement Financial Resources/Insurance Housing Intimacy Leisure Time Physical Health Resilience Social Support Talents/Skills Transportation Vocational/Educational  ADL's:  Intact  Cognition:  WNL  Sleep:        Assessment and Plan: Depression screen [facility] 2/9 02/18/2021 11/12/2020 10/15/2020 05/28/2020  Decreased Interest 0 1 2 2   Down, Depressed, Hopeless 0 1 1 2   PHQ - 2 Score 0 2 3 4   Altered sleeping - 0 3 2  Tired, decreased energy - 2 3 2   Change in appetite - 0 0 0  Feeling bad or failure about yourself  - 1 2 0  Trouble concentrating - 0 1 0  Moving slowly or fidgety/restless - 0 0 0  Suicidal thoughts - 0 0 0  PHQ-9 Score - 5 12 8   Difficult doing work/chores - Somewhat difficult Somewhat difficult Somewhat difficult    Flowsheet Row Video Visit from 02/18/2021 in [facility] Video Visit from 11/12/2020 in [facility] Video Visit from 10/15/2020 in [facility]  C-SSRS RISK CATEGORY No Risk No Risk No Risk      Her due date is April 23, 2021  1.  MDD (major depressive disorder), recurrent episode, moderate (HCC)  2. GAD (generalized anxiety disorder)  3. PTSD (post-traumatic stress disorder)    Follow Up Instructions: In 1-2 months or sooner if needed   I discussed the assessment and treatment plan with the patient. The patient was provided an opportunity to ask questions and all were answered. The patient agreed with the plan and demonstrated an understanding of the instructions.   The patient was advised to call back or  seek an in-person evaluation if the symptoms worsen or if the condition fails to improve as anticipated.  I provided 11 minutes of non-face-to-face time during this encounter.   Oletta Darter, MD

## 2021-03-25 LAB — OB RESULTS CONSOLE GBS: GBS: NEGATIVE

## 2021-04-01 ENCOUNTER — Telehealth (HOSPITAL_COMMUNITY): Payer: BC Managed Care – PPO | Admitting: Psychiatry

## 2021-04-08 ENCOUNTER — Telehealth (HOSPITAL_COMMUNITY): Payer: BC Managed Care – PPO | Admitting: Psychiatry

## 2021-04-12 ENCOUNTER — Inpatient Hospital Stay (HOSPITAL_COMMUNITY)
Admission: AD | Admit: 2021-04-12 | Discharge: 2021-04-15 | DRG: 806 | Disposition: A | Discharge status: Home / Self Care | Payer: BC Managed Care – PPO | Attending: Obstetrics and Gynecology | Admitting: Obstetrics and Gynecology

## 2021-04-12 ENCOUNTER — Encounter (HOSPITAL_COMMUNITY): Payer: Self-pay

## 2021-04-12 DIAGNOSIS — F4310 Post-traumatic stress disorder, unspecified: Secondary | ICD-10-CM | POA: Diagnosis present

## 2021-04-12 DIAGNOSIS — Z20822 Contact with and (suspected) exposure to covid-19: Secondary | ICD-10-CM | POA: Diagnosis present

## 2021-04-12 DIAGNOSIS — O99893 Other specified diseases and conditions complicating puerperium: Secondary | ICD-10-CM | POA: Diagnosis not present

## 2021-04-12 DIAGNOSIS — O4290 Premature rupture of membranes, unspecified as to length of time between rupture and onset of labor, unspecified weeks of gestation: Secondary | ICD-10-CM | POA: Diagnosis not present

## 2021-04-12 DIAGNOSIS — O4292 Full-term premature rupture of membranes, unspecified as to length of time between rupture and onset of labor: Secondary | ICD-10-CM | POA: Diagnosis present

## 2021-04-12 DIAGNOSIS — R55 Syncope and collapse: Secondary | ICD-10-CM | POA: Diagnosis not present

## 2021-04-12 DIAGNOSIS — O9081 Anemia of the puerperium: Secondary | ICD-10-CM | POA: Diagnosis not present

## 2021-04-12 DIAGNOSIS — F329 Major depressive disorder, single episode, unspecified: Secondary | ICD-10-CM | POA: Diagnosis present

## 2021-04-12 DIAGNOSIS — Z3A38 38 weeks gestation of pregnancy: Secondary | ICD-10-CM | POA: Diagnosis not present

## 2021-04-12 DIAGNOSIS — D62 Acute posthemorrhagic anemia: Secondary | ICD-10-CM | POA: Diagnosis not present

## 2021-04-12 DIAGNOSIS — O9902 Anemia complicating childbirth: Secondary | ICD-10-CM | POA: Diagnosis not present

## 2021-04-12 DIAGNOSIS — F411 Generalized anxiety disorder: Secondary | ICD-10-CM | POA: Diagnosis present

## 2021-04-12 HISTORY — DX: Other complications of anesthesia, initial encounter: T88.59XA

## 2021-04-12 MED ORDER — FENTANYL CITRATE (PF) 100 MCG/2ML IJ SOLN
50.0000 ug | INTRAMUSCULAR | Status: DC | PRN
  Administered 2021-04-13: 100 ug via INTRAVENOUS
  Filled 2021-04-12: qty 2

## 2021-04-12 MED ORDER — LIDOCAINE HCL (PF) 1 % IJ SOLN
30.0000 mL | INTRAMUSCULAR | Status: AC | PRN
  Administered 2021-04-13: 30 mL via SUBCUTANEOUS
  Filled 2021-04-12: qty 30

## 2021-04-12 MED ORDER — LACTATED RINGERS IV SOLN: INTRAVENOUS | Status: DC

## 2021-04-12 MED ORDER — OXYTOCIN BOLUS FROM INFUSION
333.0000 mL | Freq: Once | INTRAVENOUS | Status: AC
  Administered 2021-04-13: 333 mL via INTRAVENOUS

## 2021-04-12 MED ORDER — OXYTOCIN 10 UNIT/ML IJ SOLN: 10.0000 [IU] | Freq: Once | INTRAMUSCULAR | Status: DC

## 2021-04-12 MED ORDER — OXYTOCIN-SODIUM CHLORIDE 30-0.9 UT/500ML-% IV SOLN
1.0000 m[IU]/min | INTRAVENOUS | Status: DC
  Administered 2021-04-13: 2 m[IU]/min via INTRAVENOUS
  Filled 2021-04-12: qty 500

## 2021-04-12 MED ORDER — TERBUTALINE SULFATE 1 MG/ML IJ SOLN: 0.2500 mg | Freq: Once | INTRAMUSCULAR | Status: DC | PRN

## 2021-04-12 MED ORDER — SODIUM CHLORIDE 0.9% FLUSH: 3.0000 mL | Freq: Two times a day (BID) | INTRAVENOUS | Status: DC

## 2021-04-12 MED ORDER — LACTATED RINGERS IV SOLN
500.0000 mL | INTRAVENOUS | Status: DC | PRN
  Administered 2021-04-13: 250 mL via INTRAVENOUS

## 2021-04-12 MED ORDER — ACETAMINOPHEN 500 MG PO TABS: 1000.0000 mg | ORAL_TABLET | Freq: Four times a day (QID) | ORAL | Status: DC | PRN

## 2021-04-12 MED ORDER — SOD CITRATE-CITRIC ACID 500-334 MG/5ML PO SOLN: 30.0000 mL | ORAL | Status: DC | PRN

## 2021-04-12 MED ORDER — OXYTOCIN-SODIUM CHLORIDE 30-0.9 UT/500ML-% IV SOLN: 2.5000 [IU]/h | INTRAVENOUS | Status: DC

## 2021-04-12 MED ORDER — SODIUM CHLORIDE 0.9% FLUSH: 3.0000 mL | INTRAVENOUS | Status: DC | PRN

## 2021-04-12 MED ORDER — ONDANSETRON HCL 4 MG/2ML IJ SOLN: 4.0000 mg | Freq: Four times a day (QID) | INTRAMUSCULAR | Status: DC | PRN

## 2021-04-12 MED ORDER — SODIUM CHLORIDE 0.9 % IV SOLN: 250.0000 mL | INTRAVENOUS | Status: DC | PRN

## 2021-04-12 NOTE — H&P (Signed)
OB ADMISSION/ HISTORY & PHYSICAL: ? ?Admission Date: 04/12/2021 10:46 PM  ?Admit Diagnosis: Full-term premature rupture of membranes, unspecified duration to onset of labor [O42.92]   ? ?Olivia Woodard is a 26 y.o. female G1P0 at [redacted]w[redacted]d presenting for spontaneous rupture of membranes. Patient was seen in the office this AM and reported that she started having gushes of clear fluid at 0430. SROM was confirmed with + pooling, + nitrazine, and + ferning. SVE was FT/thick/high. Patient had a NST that was reactive. Patient declined augmentation of labor and desired to go home to await labor. Patient went home with labor precautions. Presented to hospital for direct admission at 2246. Patient reports no contractions since SROM and denies vaginal bleeding. Endorses + fetal movement. Planning unmedicated waterbirth. Husband, Merrilee Seashore, present and supportive. Eagerly anticipating a baby girl, "Olivia Woodard".  ? ?Prenatal History: ?G1P0   ?EDC: 04/23/2021 ?Prenatal care at [place] since 21 weeks, transfer from [facility] for waterbirth option ?Primary: A. Ronnald Ramp, CNM ? ?Prenatal course complicated by: ?Placenta previa, resolved at 35 weeks, 4.2cm from os ?Anxiety and depression, currently no medication ? ?Prenatal Labs: ?ABO, Rh: AB (11/16 0800)  ?Antibody: Negative (11/16 0800) ?Rubella: Immune (11/16 0800)  ?RPR: Nonreactive (01/12 0800)  ?HBsAg: Negative (11/16 0800)  ?HIV: Non-reactive (11/16 0800)  ?GBS: Negative/-- (02/23 0800)  ?1 hr Glucola : 100 ?Genetic Screening: Declines ?Ultrasound: normal XX anatomy, anterior placenta, last growth 43% at 27 weeks ? ?TDaP          Declined ?COVID-19 Declined ? ?  ?Maternal Diabetes: No ?Genetic Screening: Declined ?Maternal Ultrasounds/Referrals: Normal ?Fetal Ultrasounds or other Referrals:  None ?Maternal Substance Abuse:  No ?Significant Maternal Medications:  None ?Significant Maternal Lab Results:  Group B Strep negative ?Other Comments:  None ? ?Medical / Surgical  History : ?Past medical history:  ?Past Medical History:  ?Diagnosis Date  ? Abdominal pain   ? daily  ? Anxiety   ? takes Citalopram daily   ? Complication of anesthesia   ? respiratory difficulty after anesthesia following endoscopy  ? Depression   ? Esophagitis   ? Seasonal allergies   ? Vision abnormalities   ? wears glasses  ?  ?Past surgical history:  ?Past Surgical History:  ?Procedure Laterality Date  ? ESOPHAGOGASTRODUODENOSCOPY  2003  ? ESOPHAGOGASTRODUODENOSCOPY N/A 05/04/2012  ? Procedure: ESOPHAGOGASTRODUODENOSCOPY (EGD);  Surgeon: Oletha Blend, MD;  Location: [place];  Service: Gastroenterology;  Laterality: N/A;  ? TONSILLECTOMY AND ADENOIDECTOMY    ? age 9  ?  ?Family History:  ?Family History  ?Adopted: Yes  ?Problem Relation Age of Onset  ? Bipolar disorder Mother   ? Drug abuse Mother   ? Alcohol abuse Mother   ? Drug abuse Father   ? Bipolar disorder Father   ? Alcohol abuse Father   ? Alcohol abuse Maternal Grandfather   ?  ?Social History:  reports that she has never smoked. She has never used smokeless tobacco. She reports that she does not currently use alcohol. She reports that she does not use drugs. ? ?Allergies: ?Patient has no known allergies.  ? ?Current Medications at time of admission:  ?Medications Prior to Admission  ?Medication Sig Dispense Refill Last Dose  ? Prenatal Vit-Fe Fumarate-FA (PRENATAL MULTIVITAMIN) TABS tablet Take 1 tablet by mouth daily at 12 noon.   04/12/2021 at 0800  ? JUNEL 1/20 1-20 MG-MCG tablet TAKE ONE TABLET BY MOUTH DAILY CONTINUOUSLY X 63 DAYS, BREAK FOR 7 DAYS, START NEXT CYCLE (  Patient not taking: Reported on 11/12/2020)  4 More than a month  ?  ?Review of Systems: ?Review of Systems  ?Psychiatric/Behavioral:  The patient is nervous/anxious.   ?All other systems reviewed and are negative. ? ?Physical Exam: ?Vital signs and nursing notes reviewed. ? ?Patient Vitals for the past 24 hrs: ? Height Weight  ?04/12/21 2251 5\' 1"  (1.549 m) 79.7 kg  ?  ?General: AAO  x 3, NAD ?Heart: RRR ?Lungs:CTAB ?Abdomen: Gravid, NT ?Extremities: trace edema ?SVE: 1/50/-2 ? ?Foley balloon placed without difficulty. 60cc of fluid instilled in balloon and taped to leg for traction. Patient tolerated procedure well.  ? ?FHR: 140BPM, moderate variability, + accels, no decels ?TOCO: Contractions occasional ? ?Labs:   ?No results for input(s): WBC, HGB, HCT, PLT in the last 72 hours. ? ?Assessment: ? 26 y.o. G1P0 at [redacted]w[redacted]d, PROM at 0430, clear fluid ? ?1. PROM, clear fluid ?2. FHR category I ?3. GBS negative ?4. Desires unmedicated waterbirth ?5. Plans to breastfeed ?6. History of anxiety and depression, no meds ? ?Plan:  ?1. Admit to BS ?2. Routine L&D orders ?3. Analgesia/anesthesia PRN  ?4. Discussed prolonged ROM and option for augmentation. Discussed buccal Cytotec and patient declines. Agrees to Foley balloon placement and reassess labor status in the morning. ?5. Intermittent FHR monitoring while Foley balloon is in place ?6. Declines PIV; discussed anesthesia's preference for all laboring women to have PIV ?7. Close PP F/U for history of anxiety and depression ?8. Anticipate NSVB ? ?Dr. Ronita Hipps notified of admission/plan of care. ? ?Zettie Pho, MSN ?04/13/2021, 12:00 AM  ?

## 2021-04-13 ENCOUNTER — Encounter (HOSPITAL_COMMUNITY): Payer: Self-pay | Admitting: Obstetrics and Gynecology

## 2021-04-13 ENCOUNTER — Inpatient Hospital Stay (HOSPITAL_COMMUNITY): Payer: BC Managed Care – PPO | Admitting: Anesthesiology

## 2021-04-13 ENCOUNTER — Other Ambulatory Visit: Payer: Self-pay

## 2021-04-13 DIAGNOSIS — O4290 Premature rupture of membranes, unspecified as to length of time between rupture and onset of labor, unspecified weeks of gestation: Secondary | ICD-10-CM | POA: Diagnosis not present

## 2021-04-13 LAB — COMPREHENSIVE METABOLIC PANEL
ALT: 13 U/L (ref 0–44)
AST: 24 U/L (ref 15–41)
Albumin: 2.4 g/dL — ABNORMAL LOW (ref 3.5–5.0)
Alkaline Phosphatase: 122 U/L (ref 38–126)
Anion gap: 8 (ref 5–15)
BUN: 5 mg/dL — ABNORMAL LOW (ref 6–20)
CO2: 23 mmol/L (ref 22–32)
Calcium: 8.6 mg/dL — ABNORMAL LOW (ref 8.9–10.3)
Chloride: 103 mmol/L (ref 98–111)
Creatinine, Ser: 0.61 mg/dL (ref 0.44–1.00)
GFR, Estimated: >60 mL/min (ref >60)
Glucose, Bld: 159 mg/dL — ABNORMAL HIGH (ref 70–99)
Potassium: 2.9 mmol/L — ABNORMAL LOW (ref 3.5–5.1)
Sodium: 134 mmol/L — ABNORMAL LOW (ref 135–145)
Total Bilirubin: 0.4 mg/dL (ref 0.3–1.2)
Total Protein: 5.1 g/dL — ABNORMAL LOW (ref 6.5–8.1)

## 2021-04-13 LAB — CBC
HCT: 37.1 % (ref 36.0–46.0)
HCT: 32.8 % — ABNORMAL LOW (ref 36.0–46.0)
Hemoglobin: 13 g/dL (ref 12.0–15.0)
Hemoglobin: 10.9 g/dL — ABNORMAL LOW (ref 12.0–15.0)
MCH: 30.8 pg (ref 26.0–34.0)
MCH: 29.8 pg (ref 26.0–34.0)
MCHC: 35 g/dL (ref 30.0–36.0)
MCHC: 33.2 g/dL (ref 30.0–36.0)
MCV: 87.9 fL (ref 80.0–100.0)
MCV: 89.6 fL (ref 80.0–100.0)
Platelets: 192 10*3/uL (ref 150–400)
Platelets: 228 10*3/uL (ref 150–400)
RBC: 4.22 MIL/uL (ref 3.87–5.11)
RBC: 3.66 MIL/uL — ABNORMAL LOW (ref 3.87–5.11)
RDW: 13 % (ref 11.5–15.5)
RDW: 13 % (ref 11.5–15.5)
WBC: 15.5 10*3/uL — ABNORMAL HIGH (ref 4.0–10.5)
WBC: 24.3 10*3/uL — ABNORMAL HIGH (ref 4.0–10.5)
nRBC: 0 % (ref 0.0–0.2)
nRBC: 0 % (ref 0.0–0.2)

## 2021-04-13 LAB — RESP PANEL BY RT-PCR (FLU A&B, COVID) ARPGX2
Influenza A by PCR: NEGATIVE
Influenza B by PCR: NEGATIVE
SARS Coronavirus 2 by RT PCR: NEGATIVE

## 2021-04-13 LAB — TYPE AND SCREEN
ABO/RH(D): AB POS
Antibody Screen: NEGATIVE

## 2021-04-13 LAB — RPR: RPR Ser Ql: NONREACTIVE

## 2021-04-13 MED ORDER — DIBUCAINE (PERIANAL) 1 % EX OINT: 1.0000 "application " | TOPICAL_OINTMENT | CUTANEOUS | Status: DC | PRN

## 2021-04-13 MED ORDER — ACETAMINOPHEN 325 MG PO TABS: 650.0000 mg | ORAL_TABLET | ORAL | Status: DC | PRN

## 2021-04-13 MED ORDER — LIDOCAINE HCL (PF) 1 % IJ SOLN
INTRAMUSCULAR | Status: DC | PRN
Start: 2021-04-13 — End: 2021-04-13
  Administered 2021-04-13 (×2): 5 mL via EPIDURAL

## 2021-04-13 MED ORDER — DIPHENHYDRAMINE HCL 25 MG PO CAPS: 25.0000 mg | ORAL_CAPSULE | Freq: Four times a day (QID) | ORAL | Status: DC | PRN

## 2021-04-13 MED ORDER — DIPHENHYDRAMINE HCL 50 MG/ML IJ SOLN: 12.5000 mg | INTRAMUSCULAR | Status: DC | PRN

## 2021-04-13 MED ORDER — EPHEDRINE 5 MG/ML INJ: 10.0000 mg | INTRAVENOUS | Status: DC | PRN

## 2021-04-13 MED ORDER — FENTANYL-BUPIVACAINE-NACL 0.5-0.125-0.9 MG/250ML-% EP SOLN
12.0000 mL/h | EPIDURAL | Status: DC | PRN
  Administered 2021-04-13: 12 mL/h via EPIDURAL
  Filled 2021-04-13: qty 250

## 2021-04-13 MED ORDER — IBUPROFEN 600 MG PO TABS
600.0000 mg | ORAL_TABLET | Freq: Four times a day (QID) | ORAL | Status: DC
  Administered 2021-04-14 – 2021-04-15 (×5): 600 mg via ORAL
  Filled 2021-04-13 (×6): qty 1

## 2021-04-13 MED ORDER — PHENYLEPHRINE 40 MCG/ML (10ML) SYRINGE FOR IV PUSH (FOR BLOOD PRESSURE SUPPORT): 80.0000 ug | PREFILLED_SYRINGE | INTRAVENOUS | Status: DC | PRN

## 2021-04-13 MED ORDER — TRANEXAMIC ACID-NACL 1000-0.7 MG/100ML-% IV SOLN
INTRAVENOUS | Status: AC
  Filled 2021-04-13: qty 100

## 2021-04-13 MED ORDER — WITCH HAZEL-GLYCERIN EX PADS: 1.0000 "application " | MEDICATED_PAD | CUTANEOUS | Status: DC | PRN

## 2021-04-13 MED ORDER — LACTATED RINGERS IV SOLN
500.0000 mL | Freq: Once | INTRAVENOUS | Status: AC
  Administered 2021-04-13: 500 mL via INTRAVENOUS

## 2021-04-13 MED ORDER — ONDANSETRON HCL 4 MG PO TABS: 4.0000 mg | ORAL_TABLET | ORAL | Status: DC | PRN

## 2021-04-13 MED ORDER — COCONUT OIL OIL: 1.0000 "application " | TOPICAL_OIL | Status: DC | PRN

## 2021-04-13 MED ORDER — TRANEXAMIC ACID-NACL 1000-0.7 MG/100ML-% IV SOLN
1000.0000 mg | Freq: Once | INTRAVENOUS | Status: AC
  Administered 2021-04-13: 1000 mg via INTRAVENOUS

## 2021-04-13 MED ORDER — LACTATED RINGERS IV SOLN: Freq: Once | INTRAVENOUS | Status: AC

## 2021-04-13 MED ORDER — PRENATAL MULTIVITAMIN CH
1.0000 | ORAL_TABLET | Freq: Every day | ORAL | Status: DC
  Administered 2021-04-14: 1 via ORAL
  Filled 2021-04-13: qty 1

## 2021-04-13 MED ORDER — ZOLPIDEM TARTRATE 5 MG PO TABS: 5.0000 mg | ORAL_TABLET | Freq: Every evening | ORAL | Status: DC | PRN

## 2021-04-13 MED ORDER — SIMETHICONE 80 MG PO CHEW: 80.0000 mg | CHEWABLE_TABLET | ORAL | Status: DC | PRN

## 2021-04-13 MED ORDER — SENNOSIDES-DOCUSATE SODIUM 8.6-50 MG PO TABS
2.0000 | ORAL_TABLET | Freq: Every day | ORAL | Status: DC
  Administered 2021-04-14: 2 via ORAL
  Filled 2021-04-13: qty 2

## 2021-04-13 MED ORDER — ONDANSETRON HCL 4 MG/2ML IJ SOLN: 4.0000 mg | INTRAMUSCULAR | Status: DC | PRN

## 2021-04-13 MED ORDER — BENZOCAINE-MENTHOL 20-0.5 % EX AERO
1.0000 "application " | INHALATION_SPRAY | CUTANEOUS | Status: DC | PRN
  Administered 2021-04-14 – 2021-04-15 (×2): 1 via TOPICAL
  Filled 2021-04-13 (×2): qty 56

## 2021-04-13 MED ORDER — MISOPROSTOL 50MCG HALF TABLET
50.0000 ug | ORAL_TABLET | ORAL | Status: DC
  Administered 2021-04-13: 50 ug via ORAL
  Filled 2021-04-13: qty 1

## 2021-04-13 MED ORDER — TETANUS-DIPHTH-ACELL PERTUSSIS 5-2.5-18.5 LF-MCG/0.5 IM SUSY: 0.5000 mL | PREFILLED_SYRINGE | Freq: Once | INTRAMUSCULAR | Status: DC

## 2021-04-13 NOTE — Anesthesia Preprocedure Evaluation (Signed)
Anesthesia Evaluation  Patient identified by MRN, date of birth, ID band Patient awake    Reviewed: Allergy & Precautions, NPO status , Patient's Chart, lab work & pertinent test results  Airway Mallampati: II  TM Distance: >3 FB Neck ROM: Full    Dental no notable dental hx. (+) Dental Advisory Given   Pulmonary neg pulmonary ROS,    Pulmonary exam normal        Cardiovascular negative cardio ROS Normal cardiovascular exam     Neuro/Psych PSYCHIATRIC DISORDERS Anxiety Depression negative neurological ROS     GI/Hepatic negative GI ROS, Neg liver ROS,   Endo/Other  negative endocrine ROS  Renal/GU negative Renal ROS  negative genitourinary   Musculoskeletal negative musculoskeletal ROS (+)   Abdominal   Peds negative pediatric ROS (+)  Hematology negative hematology ROS (+)   Anesthesia Other Findings   Reproductive/Obstetrics (+) Pregnancy                             Anesthesia Physical Anesthesia Plan  ASA: 2  Anesthesia Plan: Epidural   Post-op Pain Management:    Induction:   PONV Risk Score and Plan:   Airway Management Planned: Natural Airway  Additional Equipment:   Intra-op Plan:   Post-operative Plan:   Informed Consent: I have reviewed the patients History and Physical, chart, labs and discussed the procedure including the risks, benefits and alternatives for the proposed anesthesia with the patient or authorized representative who has indicated his/her understanding and acceptance.     Dental advisory given  Plan Discussed with: Anesthesiologist  Anesthesia Plan Comments:         Anesthesia Quick Evaluation  

## 2021-04-13 NOTE — Progress Notes (Signed)
While transporting patient from room 2S11 to room 4S18 patient was holding infant in blankets in a wheelchair. When exiting the elevator pt became limp, slumped to the left side and demonstrated seizure like jerking movement for approximately 15 to 20 seconds. Infant was taken from mother by C. Tommie Raymond, RN. Father "Merrilee Seashore" was instructed to call for help. Hospital staff arrived to the elevator and took Steele of infant and assisted C. Tommie Raymond, RN to transport patient to room [phone] via wheelchair and assisted patient to bed via lift x4. RROB Wendi Maya, RN and Gavin Potters, CNM were called to bedside to assess. Arlis Porta, CNM given report by RN and CNM gave orders for stat labs and to restart fluids. Fundal tone and bleeding checked x2. Report given and care transferred to Hildred Alamin, RN.  ? ?Cletus Gash, RN ?

## 2021-04-13 NOTE — Anesthesia Procedure Notes (Signed)
Epidural ?Patient location during procedure: OB ?Start time: 04/13/2021 2:43 PM ?End time: 04/13/2021 2:54 PM ? ?Staffing ?Anesthesiologist: Heather Roberts, MD ?Performed: anesthesiologist  ? ?Preanesthetic Checklist ?Completed: patient identified, IV checked, site marked, risks and benefits discussed, monitors and equipment checked, pre-op evaluation and timeout performed ? ?Epidural ?Patient position: sitting ?Prep: DuraPrep ?Patient monitoring: heart rate, cardiac monitor, continuous pulse ox and blood pressure ?Approach: midline ?Location: L2-L3 ?Injection technique: LOR saline ? ?Needle:  ?Needle type: Tuohy  ?Needle gauge: 17 G ?Needle length: 9 cm ?Needle insertion depth: 5 cm ?Catheter size: 20 Guage ?Catheter at skin depth: 10 cm ?Test dose: negative and Other ? ?Assessment ?Events: blood not aspirated, injection not painful, no injection resistance and negative IV test ? ?Additional Notes ?Informed consent obtained prior to proceeding including risk of failure, 1% risk of PDPH, risk of minor discomfort and bruising.  Discussed rare but serious complications including epidural abscess, permanent nerve injury, epidural hematoma.  Discussed alternatives to epidural analgesia and patient desires to proceed.  Timeout performed pre-procedure verifying patient name, procedure, and platelet count.  Patient tolerated procedure well. ? ? ? ? ?

## 2021-04-13 NOTE — Progress Notes (Signed)
S: ?Comfortable with epidural. Husband, Merrilee Seashore, and doula, Nat Math, at the bedside.  ? ?O: ?Vitals:  ? 04/13/21 1511 04/13/21 1516 04/13/21 1517 04/13/21 1531  ?BP: 125/73 122/74 122/74 123/82  ?Pulse: (!) 102 (!) 103 (!) 103 (!) 116  ?Resp:      ?Temp:      ?TempSrc:      ?Weight:      ?Height:      ? ?FHT:  FHR: 135 bpm, variability: moderate,  accelerations:  Present,  decelerations:  Absent ?UC:   irregular, every 3-4 minutes ?SVE:   Dilation: 3 ?Effacement (%): 90 ?Station: -1 ?Exam by: H.Price, RN ? ?A / P: ?Augmentation of labor, PROM 34+ hours, now on Pitocin ? ?Fetal Wellbeing:  Category I ?I/D: Afebrile, no signs or symptoms of infection ?Pain Control:  Epidural ?Anticipated MOD:  NSVD ? ?Continue to titrate Pitocin 2x2. Encourage frequent position changes to facilitate fetal rotation and descent.  ? ?Dr. Pamala Hurry updated on patient status and plan of care.  ? ?Zettie Pho, MSN ?04/13/2021, 3:33 PM ? ?   ?

## 2021-04-13 NOTE — Progress Notes (Signed)
Late Entry: ? ?RROB called to room 418 for seizure vs syncopal episode in the elevator. When this RROB RN arrived to patients room, patient was lying in bed, visibly pale and sweaty however alert and verbally appropriate. RN in process of connecting patient to dynamap machine. Vitals mostly WNL (HR 107). Patient alert and oriented x3. CNM arrived to room and given report of events by RN. CNM gave orders for bolus and stat labs. Patient fundus and bleeding assessed x2 and WNL. Patient deemed stable by CNM at this time. ? ?Lovenia Shuck, RN RROB  ?

## 2021-04-13 NOTE — Progress Notes (Signed)
Called by RN at 2017 reporting possible seizure activity in elevator on transport to 4th floor. When CNM arrived in the room, patient was alert and oriented. States she "felt faint' and briefly lost consciousness for approximately 15-20 seconds. Witnessed by [employer]. Husband was in the elevator but denies witnessing seizure activity. BP WNL with mild tachycardia 100s.  ? ?No history of seizure disorder or hypertension. Total EBL at delivery 400cc, TXA given. PROM x 37+ hours. Was straight cathed prior to getting in the wheelchair. Afebrile. Seizure precautions initiated. CBC and CMP ordered stat. Will start LR at 125cc/hr.  ? ?Consult with Dr. Ernestina Penna. Agree with plan of care. Likely syncopal episode. Will continue to closely monitor.  ? ?June Leap, MSN, CNM ?04/13/2021, 8:43 PM  ?

## 2021-04-13 NOTE — Lactation Note (Addendum)
This note was copied from a baby's chart. ?Lactation Consultation Note ? ?Patient Name: Olivia Woodard ?Today's Date: 04/13/2021 ?Reason for consult: Initial assessment;Mother's request;Primapara;1st time breastfeeding;Early term 37-38.6wks;Infant < 6lbs;Breastfeeding assistance ?Age:26 hours ? ?LC reviewed LPTI guidelines for reducing calorie loss being infant less than 6 lbs. Parents aware to keep total feeding under 30 min.  ? ?Plan 1. To feed based on cues 8-12x 24hr period. Mom to offer breasts and look for signs of milk transfer.  ?2. Mom to supplement 5-10 ml per feeding with EBM.  ?3. DEBP q 3hrs for 15 min  ?4. I and O sheet reviewed.  ?All questions answered at the end of the visit.  ?Mom like to pump first to see if she gets any volume. If not, Mom to discuss with RN, Ellin Mayhew supplementation preference. LC reviewed with Mom DBM is an option.  ? ? ? ?Maternal Data ?Has patient been taught Hand Expression?: Yes ?Does the patient have breastfeeding experience prior to this delivery?: No ? ?Feeding ?Mother's Current Feeding Choice: Breast Milk ? ?LATCH Score ?Latch: Repeated attempts needed to sustain latch, nipple held in mouth throughout feeding, stimulation needed to elicit sucking reflex. ? ?Audible Swallowing: A few with stimulation ? ?Type of Nipple: Everted at rest and after stimulation ? ?Comfort (Breast/Nipple): Soft / non-tender ? ?Hold (Positioning): Assistance needed to correctly position infant at breast and maintain latch. ? ?LATCH Score: 7 ? ? ?Lactation Tools Discussed/Used ?Tools: Pump;Flanges ?Flange Size: 21 ?Breast pump type: Double-Electric Breast Pump ?Pump Education: Setup, frequency, and cleaning;Milk Storage ?Reason for Pumping: increase stimulation ?Pumping frequency: every 3 hrs for 15 min ? ?Interventions ?Interventions: Breast feeding basics reviewed;Assisted with latch;Skin to skin;Breast massage;Hand express;Breast compression;Adjust position;Support pillows;Position  options;Expressed milk;DEBP;Education;LC Services brochure;Infant Driven Feeding Algorithm education ? ?Discharge ?Pump: Personal;DEBP ?WIC Program: No ? ?Consult Status ?Consult Status: Follow-up ?Date: 04/14/21 ?Follow-up type: In-patient ? ? ? ?Simcha Speir  Nicholson-Springer ?04/13/2021, 11:05 PM ? ? ? ?

## 2021-04-13 NOTE — Lactation Note (Signed)
This note was copied from a baby's chart. ?Lactation Consultation Note ? ?Patient Name: Girl Dimple Bastyr ?Today's Date: 04/13/2021 ?Reason for consult: L&D Initial assessment;Early term 37-38.6wks;1st time breastfeeding;Primapara ?Age:26 hours ? ? ?Initial L&D Consult: ? ?Visited with family < 1 hour after birth. ?Assisted to latch easily and observed her feeding for 10 minutes prior to exiting the room.  Reassured parents that lactation services will be available on the M/B unit.  Allowed time for family bonding.  Father and doula present.   ? ? ?Maternal Data ?  ? ?Feeding ?Mother's Current Feeding Choice: Breast Milk ? ?LATCH Score ?Latch: Grasps breast easily, tongue down, lips flanged, rhythmical sucking. ? ?Audible Swallowing: None ? ?Type of Nipple: Flat ? ?Comfort (Breast/Nipple): Soft / non-tender ? ?Hold (Positioning): Assistance needed to correctly position infant at breast and maintain latch. ? ?LATCH Score: 6 ? ? ?Lactation Tools Discussed/Used ?  ? ?Interventions ?Interventions: Assisted with latch;Skin to skin ? ?Discharge ?  ? ?Consult Status ?Consult Status: Follow-up from L&D ? ? ? ?Infinity Jeffords R Natanel Snavely ?04/13/2021, 6:50 PM ? ? ? ?

## 2021-04-13 NOTE — Progress Notes (Signed)
S: ?Denies contractions. Was able to sleep from 0200-0730 after Foley balloon was expelled. Expresses readiness for medical augmentation of labor now that she is 24+ hours post SROM. Husband, Weston Brass, present and supportive.  ? ?O: ?Vitals:  ? 04/13/21 0500 04/13/21 0730 04/13/21 0847 04/13/21 0919  ?BP: (!) 94/51 120/72 118/73 117/70  ?Pulse: 93 96 94 (!) 102  ?Resp: 14 16    ?Temp: 98.1 ?F (36.7 ?C) 98.1 ?F (36.7 ?C)    ?TempSrc: Oral Oral    ?Weight:      ?Height:      ? ?FHT:  FHR: 140 bpm, variability: moderate,  accelerations:  Present,  decelerations:  Absent ?UC:   occasional ?SVE:   deferred due to prolonged ROM, 27 hours, afebrile, vertex confirmed by bedside U/S ? ?A / P: ?Protracted latent phase after PROM 04/12/2021 at 0430, clear fluid ? ?Fetal Wellbeing:  Category I ?Pain Control:  Labor support without medications ?I/D: Afebrile, no signs or symptoms of infection ?Anticipated MOD:  NSVD ? ?Will give buccal Cytotec every 4 hours. Consider Pitocin 2x2 if patient is agreeable.  ? ?Clancy Gourd, MSN ?04/13/2021, 10:56 AM ? ?   ?

## 2021-04-13 NOTE — Progress Notes (Signed)
S: ?Feeling occasional cramping. Discussed the R/B/A of Pitocin for labor augmentation due to prolonged ROM and patient agrees to proceed with Pitocin. Doula en route for labor support.  ? ?O: ?Vitals:  ? 04/13/21 0730 04/13/21 0847 04/13/21 0919 04/13/21 1114  ?BP: 120/72 118/73 117/70 119/74  ?Pulse: 96 94 (!) 102 (!) 121  ?Resp: 16     ?Temp: 98.1 ?F (36.7 ?C)     ?TempSrc: Oral     ?Weight:      ?Height:      ? ?FHT:  FHR: 140 bpm, variability: moderate,  accelerations:  Present,  decelerations:  Absent ?UC:   irregular, every 3-5 minutes ?SVE:   Dilation: 2 ?Effacement (%): 80 ?Station: -3 ?Exam by: A. Ronnald Ramp, CNM ? ?A / P: ?Protracted latent phase after PROM 04/12/2021 at 0430, clear fluid, s/p 1 dose of buccal Cytotec ? ?Fetal Wellbeing:  Category I ?Pain Control:  Labor support without medications ?I/D: Afebrile, no signs or symptoms of infection ?Anticipated MOD:  NSVD ? ?Will start Pitocin 2x2 until active labor.  ? ?Dr. Ronita Hipps updated on patient status and plan of care.  ? ?Zettie Pho, MSN ?04/13/2021, 11:27 AM ? ?   ?

## 2021-04-14 DIAGNOSIS — R55 Syncope and collapse: Secondary | ICD-10-CM | POA: Diagnosis not present

## 2021-04-14 DIAGNOSIS — O9902 Anemia complicating childbirth: Secondary | ICD-10-CM | POA: Diagnosis not present

## 2021-04-14 LAB — CBC
HCT: 28.4 % — ABNORMAL LOW (ref 36.0–46.0)
Hemoglobin: 9.7 g/dL — ABNORMAL LOW (ref 12.0–15.0)
MCH: 30.3 pg (ref 26.0–34.0)
MCHC: 34.2 g/dL (ref 30.0–36.0)
MCV: 88.8 fL (ref 80.0–100.0)
Platelets: 181 10*3/uL (ref 150–400)
RBC: 3.2 MIL/uL — ABNORMAL LOW (ref 3.87–5.11)
RDW: 13.1 % (ref 11.5–15.5)
WBC: 19.4 10*3/uL — ABNORMAL HIGH (ref 4.0–10.5)
nRBC: 0 % (ref 0.0–0.2)

## 2021-04-14 MED ORDER — MAGNESIUM OXIDE -MG SUPPLEMENT 400 (240 MG) MG PO TABS
400.0000 mg | ORAL_TABLET | Freq: Every day | ORAL | Status: DC
  Administered 2021-04-14: 400 mg via ORAL
  Filled 2021-04-14: qty 1

## 2021-04-14 MED ORDER — POLYSACCHARIDE IRON COMPLEX 150 MG PO CAPS
150.0000 mg | ORAL_CAPSULE | Freq: Every day | ORAL | Status: DC
Start: 2021-04-14 — End: 2021-04-15
  Administered 2021-04-14: 150 mg via ORAL
  Filled 2021-04-14: qty 1

## 2021-04-14 NOTE — Social Work (Signed)
MOB was referred for history of depression, PTSD and anxiety.  ? ?* Referral screened out by Clinical Social Worker because none of the following criteria appear to apply: ? ?~ History of anxiety/depression during this pregnancy, or of post-partum depression following prior delivery. ?~ Diagnosis of anxiety and/or depression within last 3 years. ?OR ?* MOB's symptoms currently being treated with medication and/or therapy. CSW reviewed chart and notes MOB currently in counseling. ? ?Please contact the Clinical Social Worker if needs arise, by MOB request, or if MOB scores greater than 9/yes to question 10 on Edinburgh Postpartum Depression Screen. ? ?Liberato Stansbery, LCSW ?Clinical Social Work ?[facility]  ?[phone]  ?

## 2021-04-14 NOTE — Progress Notes (Signed)
PPD#1 S/P NSVD ? ?Live born female  ?Birth Weight: 5 lb 14.7 oz (2685 g) ?APGAR: 8, 9 ? ?Newborn Delivery   ?Birth date/time: 04/13/2021 17:41:00 ?Delivery type: Vaginal, Spontaneous ?  ?  ?Baby name: Olivia Woodard ? ?Delivering provider: Dorisann Frames K  ? ?Lacerations:2nd degree  ? ?Feeding: breast ? ?Pain control at delivery: Epidural  ? ?S:  Reports feeling well. Feeling much better than last night. Breastfeeding going well. Denies seizure activity or further syncopal episodes. ?            Tolerating PO/No nausea or vomiting ?            Bleeding is light ?            Pain controlled with acetaminophen and ibuprofen (OTC) ?            Up ad lib/ambulatory/voiding without difficulties  ? ?O:  A & O x 3, in no apparent distress  ?Vitals:  ? 04/13/21 2120 04/14/21 0037 04/14/21 0511 04/14/21 0905  ?BP: 107/70 93/68 (!) 92/56 (!) 102/59  ?Pulse: (!) 130 (!) 109 92 93  ?Resp:    16  ?Temp: 99.5 ?F (37.5 ?C) 97.7 ?F (36.5 ?C) 97.9 ?F (36.6 ?C) (!) 97.5 ?F (36.4 ?C)  ?TempSrc: Axillary Oral Oral Oral  ?SpO2: 100% 98% 97% 99%  ?Weight:      ?Height:      ? ?Recent Labs  ?  04/13/21 ?2038 04/14/21 ?0450  ?WBC 24.3* 19.4*  ?HGB 10.9* 9.7*  ?HCT 32.8* 28.4*  ?PLT 228 181  ? ? Blood type: --/--/AB POS (03/14 0106) ? Rubella: Immune (11/16 0800)  ? I&O: I/O last 3 completed shifts: ?In: 0  ?Out: 425 [Urine:75; Blood:350] ?         No intake/output data recorded. ? ?Gen: AAO x 3, NAD ?Abdomen: soft, non-tender, non-distended ?Fundus: firm, non-tender, U-1 ?  Perineum: repair intact ?  Lochia: small ?  Extremities: no edema, no calf pain or tenderness ?  ?A/P:  ?PPD # 1 25 y.o., G1P1001  ?Principal Problem: ?  Postpartum care following vaginal delivery 3/14 ? Doing well - stable status ? Routine post partum orders ?Active Problems: ?  GAD (generalized anxiety disorder) ?  MDD (major depressive disorder) ?  PTSD (post-traumatic stress disorder) ? No meds currently ? Close PP F/U for mood ?  Full-term premature rupture of membranes,  unspecified duration to onset of labor ?  SVD 3/14 ?  Second degree perineal laceration ? Discussed perineal care and comfort measures.  ?  Prolonged rupture of membranes ? WBC count down today ? Afebrile and VS stables ? Consult with Dr. Ernestina Penna, hold antibiotics today and watch for signs or symptoms of infection ?  Maternal anemia, with delivery ? Asymptomatic ? Start PO Niferex and mag oxide ?  Syncope ? Asymptomatic this AM after syncopal episode yesterday when transferring to postpartum ? VS stable ? Labs stable ? ?Anticipate discharge tomorrow. ?  ?June Leap, MSN, CNM ?04/14/2021, 9:22 AM ? ? ?  ?

## 2021-04-14 NOTE — Lactation Note (Signed)
This note was copied from a baby's chart. ?Lactation Consultation Note ?Mom stated baby is doing well. She is waking baby for feedings. Baby hasn't started cluster feeding yet. ?Baby had clear emesis while LC present. Parents addressed it appropriately. FOB stated she has been doing a lot of that. Explained that is one reason why she isn't that hungry yet. Baby has had good output. ?Mom is pumping stating that her nipples has been sore ever since she pumped. She thinks it was to high. Encouraged to turn down suction if hurting. ?Mom has nipple cream she has been applying stating it has helped. ?Mom seems excited and doing well. Asked to call for latch assistance as needed. ? ?Patient Name: Olivia Woodard ?Today's Date: 04/14/2021 ?Reason for consult: Follow-up assessment;Primapara;Early term 37-38.6wks ?Age:54 hours ? ?Maternal Data ?  ? ?Feeding ?  ? ?LATCH Score ?  ? ?  ? ?  ? ?  ? ?  ? ?  ? ? ?Lactation Tools Discussed/Used ?  ? ?Interventions ?  ? ?Discharge ?  ? ?Consult Status ?Consult Status: Follow-up ?Date: 04/15/21 ?Follow-up type: In-patient ? ? ? ?Charyl Dancer ?04/14/2021, 10:11 PM ? ? ? ?

## 2021-04-14 NOTE — Anesthesia Postprocedure Evaluation (Signed)
Anesthesia Post Note ? ?Patient: Olivia Woodard ? ?Procedure(s) Performed: AN AD HOC LABOR EPIDURAL ? ?  ? ?Patient location during evaluation: Mother Baby ?Anesthesia Type: Epidural ?Level of consciousness: awake and alert and oriented ?Pain management: satisfactory to patient ?Vital Signs Assessment: post-procedure vital signs reviewed and stable ?Respiratory status: respiratory function stable ?Cardiovascular status: stable ?Postop Assessment: no headache, no backache, epidural receding, patient able to bend at knees, no signs of nausea or vomiting, adequate PO intake and able to ambulate ?Anesthetic complications: no ? ? ?No notable events documented. ? ?Last Vitals:  ?Vitals:  ? 04/14/21 0905 04/14/21 1427  ?BP: (!) 102/59 106/71  ?Pulse: 93 78  ?Resp: 16 16  ?Temp: (!) 36.4 ?C 36.6 ?C  ?SpO2: 99%   ?  ?Last Pain:  ?Vitals:  ? 04/14/21 1427  ?TempSrc: Oral  ?PainSc: 0-No pain  ? ?Pain Goal:   ? ?  ?  ?  ?  ?  ?  ?  ? ?Payden Bonus ? ? ? ? ?

## 2021-04-15 MED ORDER — MAGNESIUM OXIDE -MG SUPPLEMENT 400 (240 MG) MG PO TABS: 400.0000 mg | ORAL_TABLET | Freq: Every day | ORAL | Status: AC

## 2021-04-15 MED ORDER — ACETAMINOPHEN 500 MG PO TABS: 1000.0000 mg | ORAL_TABLET | Freq: Four times a day (QID) | ORAL | 2 refills | Status: AC | PRN

## 2021-04-15 MED ORDER — COCONUT OIL OIL: 1.0000 "application " | TOPICAL_OIL | 0 refills | Status: AC | PRN

## 2021-04-15 MED ORDER — IBUPROFEN 600 MG PO TABS: 600.0000 mg | ORAL_TABLET | Freq: Four times a day (QID) | ORAL | 0 refills | Status: AC

## 2021-04-15 MED ORDER — BENZOCAINE-MENTHOL 20-0.5 % EX AERO: 1.0000 "application " | INHALATION_SPRAY | CUTANEOUS | Status: AC | PRN

## 2021-04-15 MED ORDER — POLYSACCHARIDE IRON COMPLEX 150 MG PO CAPS: 150.0000 mg | ORAL_CAPSULE | Freq: Every day | ORAL | Status: AC

## 2021-04-15 NOTE — Discharge Instructions (Signed)
Lactation outpatient support - home visit ° ° °Jessica Bowers, IBCLC (lactation consultant)  & Birth Doula ° °Phone (text or call): [phone] °Email: [email or web address] °[email or web address] ° ° °Linda Coppola °RN, MHA, IBCLC °at [facility]: Lactation Consultant ° °[email or web address] °Mail: [email or web address] °Tel: [phone] ° ° °Additional breastfeeding resources: ° °[facility] °https://ibconline.ca/information-sheets/ ° °[employer] [facility] [place] ° °www.lllofnc.org ° ° °Other Resources: ° °Chiropractic specialist  ° °Dr. Leanna Hastings °[email or web address] ° ° °Craniosacral therapy for baby ° °Erin Balkind  °[email or web address] ° °

## 2021-04-15 NOTE — Discharge Summary (Signed)
?OB Discharge Summary ? ?Patient Name: Olivia Woodard ?DOB: 11-18-1995 ?MRN: [id number] ? ?Date of admission: 04/12/2021 ?Delivering provider: Gavin Potters K  ? ?Admitting diagnosis: Full-term premature rupture of membranes, unspecified duration to onset of labor [O42.92] ?Intrauterine pregnancy: [redacted]w[redacted]d     ?Secondary diagnosis: ?Patient Active Problem List  ? Diagnosis Date Noted  ? Maternal anemia, with delivery - ABL 04/14/2021  ? Syncope 04/14/2021  ? SVD 3/14 04/13/2021  ? Postpartum care following vaginal delivery 3/14 04/13/2021  ? Second degree perineal laceration 04/13/2021  ? Prolonged rupture of membranes 04/13/2021  ? Full-term premature rupture of membranes, unspecified duration to onset of labor 04/12/2021  ? GAD (generalized anxiety disorder) 10/10/2011  ? MDD (major depressive disorder) 10/10/2011  ? PTSD (post-traumatic stress disorder) 10/10/2011  ? ?Additional problems:none  ? ?Date of discharge: 04/15/2021   ?Discharge diagnosis: Principal Problem: ?  Postpartum care following vaginal delivery 3/14 ?Active Problems: ?  GAD (generalized anxiety disorder) ?  MDD (major depressive disorder) ?  PTSD (post-traumatic stress disorder) ?  Full-term premature rupture of membranes, unspecified duration to onset of labor ?  SVD 3/14 ?  Second degree perineal laceration ?  Prolonged rupture of membranes ?  Maternal anemia, with delivery - ABL ?  Syncope ? ?                                                            ?Post partum procedures: NA ? ?Augmentation: Pitocin, Cytotec, and IP Foley ?Pain control: Epidural  ?Laceration:2nd degree  ?Episiotomy:None  ?Complications: None ? ?Hospital course:  Onset of Labor With Vaginal Delivery      ?26 y.o. yo G1P1001 at [redacted]w[redacted]d was admitted in Latent Labor on 04/12/2021. Patient had an uncomplicated labor course as follows:  ?Membrane Rupture Time/Date: 4:00 AM ,04/12/2021   ?Delivery Method:Vaginal, Spontaneous  ?Episiotomy: None  ?Lacerations:  2nd degree  ?Patient  had an uncomplicated postpartum course.  She is ambulating, tolerating a regular diet, passing flatus, and urinating well. Patient is discharged home in stable condition on 04/15/21. ? ?Newborn Data: ?Birth date:04/13/2021  ?Birth time:5:41 PM  ?Gender:Female  ?Living status:Living  ?Apgars:8 ,9  ?Weight:2685 g  ? ?Physical exam  ?Vitals:  ? 04/14/21 0905 04/14/21 1427 04/14/21 2053 04/15/21 0610  ?BP: (!) 102/59 106/71 116/73 (!) 101/58  ?Pulse: 93 78 (!) 112 (!) 107  ?Resp: 16 16 16    ?Temp: (!) 97.5 ?F (36.4 ?C) 97.8 ?F (36.6 ?C)    ?TempSrc: Oral Oral    ?SpO2: 99%  100% 98%  ?Weight:      ?Height:      ? ?General: alert, cooperative, and no distress ?Lochia: appropriate ?Uterine Fundus: firm ?Incision: N/A ?Perineum: repair intact, no edema ?DVT Evaluation: No cords or calf tenderness. ?No significant calf/ankle edema. ?Labs: ?Lab Results  ?Component Value Date  ? WBC 19.4 (H) 04/14/2021  ? HGB 9.7 (L) 04/14/2021  ? HCT 28.4 (L) 04/14/2021  ? MCV 88.8 04/14/2021  ? PLT 181 04/14/2021  ? ?CMP Latest Ref Rng & Units 04/13/2021  ?Glucose 70 - 99 mg/dL 159(H)  ?BUN 6 - 20 mg/dL 5(L)  ?Creatinine 0.44 - 1.00 mg/dL 0.61  ?Sodium 135 - 145 mmol/L 134(L)  ?Potassium 3.5 - 5.1 mmol/L 2.9(L)  ?Chloride 98 - 111 mmol/L 103  ?CO2  22 - 32 mmol/L 23  ?Calcium 8.9 - 10.3 mg/dL 1.6(X)  ?Total Protein 6.5 - 8.1 g/dL 5.1(L)  ?Total Bilirubin 0.3 - 1.2 mg/dL 0.4  ?Alkaline Phos 38 - 126 U/L 122  ?AST 15 - 41 U/L 24  ?ALT 0 - 44 U/L 13  ? ?Edinburgh Postnatal Depression Scale Screening Tool 04/14/2021  ?I have been able to laugh and see the funny side of things. 0  ?I have looked forward with enjoyment to things. 0  ?I have blamed myself unnecessarily when things went wrong. 1  ?I have been anxious or worried for no good reason. 2  ?I have felt scared or panicky for no good reason. 0  ?Things have been getting on top of me. 1  ?I have been so unhappy that I have had difficulty sleeping. 0  ?I have felt sad or miserable. 1  ?I have  been so unhappy that I have been crying. 1  ?The thought of harming myself has occurred to me. 0  ?Edinburgh Postnatal Depression Scale Total 6  ? ?Vaccines: TDaP          declined ?       COVID-19   declined ?       Flu             declined ?        Varicella     declined ?Discharge instruction:  ?per After Visit Summary,  ?[facility] booklet and  ?"Understanding Mother & Baby Care" hospital booklet ? ?After Visit Meds:  ?Allergies as of 04/15/2021   ?No Known Allergies ?  ? ?  ?Medication List  ?  ? ?STOP taking these medications   ? ?Junel 1/20 1-20 MG-MCG tablet ?Generic drug: norethindrone-ethinyl estradiol ?  ?prenatal multivitamin Tabs tablet ?  ? ?  ? ?TAKE these medications   ? ?acetaminophen 500 MG tablet ?Commonly known as: TYLENOL ?Take 2 tablets (1,000 mg total) by mouth every 6 (six) hours as needed. ?  ?benzocaine-Menthol 20-0.5 % Aero ?Commonly known as: DERMOPLAST ?Apply 1 application. topically as needed for irritation (perineal discomfort). ?  ?coconut oil Oil ?Apply 1 application. topically as needed. ?  ?ibuprofen 600 MG tablet ?Commonly known as: ADVIL ?Take 1 tablet (600 mg total) by mouth every 6 (six) hours. ?  ?iron polysaccharides 150 MG capsule ?Commonly known as: Ferrex 150 ?Take 1 capsule (150 mg total) by mouth daily. ?  ?magnesium oxide 400 (240 Mg) MG tablet ?Commonly known as: MAG-OX ?Take 1 tablet (400 mg total) by mouth daily. For prevention of constipation. ?  ? ?  ? ?  ?  ? ? ?  ?Discharge Care Instructions  ?(From admission, onward)  ?  ? ? ?  ? ?  Start     Ordered  ? 04/15/21 0000  Discharge wound care:       ?Comments: Sitz baths 2 times /day with warm water x 1 week. ?May add herbals: ?1 ounce dried comfrey leaf* ?1 ounce calendula flowers ?1 ounce lavender flowers ? ?Supplies can be found online at [employer] ?Local sources at [employer], [employer] ? ?1/2 ounce dried uva ursi leaves ?1/2 ounce witch hazel blossoms (if you can find them) ?1/2 ounce dried sage  leaf ?1/2 cup sea salt ?Directions: Bring 2 quarts of water to a boil. Turn off heat, and place 1 ounce (approximately 1 large handful) of the above mixed herbs (not the salt) into the pot. Steep, covered, for 30  minutes. ? ?Strain the liquid well with a fine mesh strainer, and discard the herb material. Add 2 quarts of liquid to the tub, along with the 1/2 cup of salt. ?This medicinal liquid can also be made into compresses and peri-rinses.  ? 04/15/21 1105  ? ?  ?  ? ?  ? ? ?Diet: iron rich diet ? ?Activity: Advance as tolerated. Pelvic rest for 6 weeks.  ? ?Postpartum contraception: TBA in office ? ?Newborn Data: ?Live born female  ?Birth Weight: 5 lb 14.7 oz (2685 g) ?APGAR: 8, 9 ? ?Newborn Delivery   ?Birth date/time: 04/13/2021 17:41:00 ?Delivery type: Vaginal, Spontaneous ?  ?  ? named [name] ?Baby Feeding: Breast ?Disposition:home with mother ? ? ?Delivery Report: ? ?Review the Delivery Report for details.   ? ?Follow up: ? Follow-up Information   ? ? Suzan Nailer, CNM. Schedule an appointment as soon as possible for a visit in 2 week(s).   ?Specialty: Certified Nurse Midwife ?Contact information: ?[address] ?[place] [address] ?[phone] ? ? ?  ?  ? ?  ?  ? ?  ? ? ? ? ?Signed: ?Otilio Carpen, MSN ?04/15/2021, 11:06 AM ? ? ?

## 2021-04-15 NOTE — Lactation Note (Signed)
This note was copied from a baby's chart. ?Lactation Consultation Note ? ?Patient Name: Girl Zianna Dercole ?Today's Date: 04/15/2021 ?Reason for consult: Follow-up assessment;1st time breastfeeding;Infant < 6lbs;Early term 37-38.6wks ?Age:26 hours ? ?P1, After diaper change, baby latched with ease. ?Discussed if baby is sleepy at the breast, suggest post pumping and giving volume back to baby. ?Reviewed engorgement care and monitoring voids/stools. ?Family has lactation home visit tonight. ? ? ?Feeding ?Mother's Current Feeding Choice: Breast Milk ? ?LATCH Score ?Latch: Grasps breast easily, tongue down, lips flanged, rhythmical sucking. ? ?Audible Swallowing: A few with stimulation ? ?Type of Nipple: Everted at rest and after stimulation ? ?Comfort (Breast/Nipple): Soft / non-tender ? ?Hold (Positioning): No assistance needed to correctly position infant at breast. ? ?LATCH Score: 9 ? ? ?Lactation Tools Discussed/Used ? DEBP ? ?Interventions ?Interventions: Breast feeding basics reviewed;Education ? ?Discharge ?Discharge Education: Engorgement and breast care;Warning signs for feeding baby;Outpatient recommendation ?Pump: DEBP;Personal (Spectra) ? ?Consult Status ?Consult Status: Complete ?Date: 04/15/21 ? ? ? ?Dahlia Byes Boschen ?04/15/2021, 12:28 PM ? ? ? ?

## 2021-04-24 ENCOUNTER — Telehealth (HOSPITAL_COMMUNITY): Payer: Self-pay

## 2021-04-24 NOTE — Telephone Encounter (Signed)
No answer. Left message to return nurse call. ? Heron Nay ?04/24/2021,1238 ?

## 2021-04-29 ENCOUNTER — Telehealth (HOSPITAL_COMMUNITY): Payer: BC Managed Care – PPO | Admitting: Psychiatry

## 2021-05-06 ENCOUNTER — Telehealth (HOSPITAL_COMMUNITY): Payer: BC Managed Care – PPO | Admitting: Psychiatry

## 2021-05-06 ENCOUNTER — Telehealth (HOSPITAL_COMMUNITY): Payer: Self-pay | Admitting: Psychiatry

## 2021-05-06 NOTE — Telephone Encounter (Signed)
I called the patient at our scheduled appointment time. There was no answer. I left a voice message for patient to call the clinic back at their convinence.   

## 2022-06-06 ENCOUNTER — Encounter (HOSPITAL_COMMUNITY): Payer: Self-pay
